# Patient Record
Sex: Female | Born: 2000
Health system: Southern US, Community
[De-identification: ages and names within clinical notes are randomized; demographics above are authoritative.]

## PROBLEM LIST (undated history)

## (undated) DIAGNOSIS — F419 Anxiety disorder, unspecified: Secondary | ICD-10-CM

## (undated) DIAGNOSIS — R569 Unspecified convulsions: Secondary | ICD-10-CM

## (undated) HISTORY — PX: TONSILLECTOMY: SUR1361

## (undated) HISTORY — DX: Anxiety disorder, unspecified: F41.9

## (undated) HISTORY — DX: Unspecified convulsions: R56.9

---

## 2018-11-25 DIAGNOSIS — Z130 Encounter for screening for diseases of the blood and blood-forming organs and certain disorders involving the immune mechanism: Secondary | ICD-10-CM | POA: Diagnosis not present

## 2019-02-18 DIAGNOSIS — R079 Chest pain, unspecified: Secondary | ICD-10-CM | POA: Diagnosis not present

## 2019-02-20 ENCOUNTER — Telehealth: Payer: Self-pay | Admitting: *Deleted

## 2019-02-20 NOTE — Telephone Encounter (Signed)
Left message for Lisa Hancock to call and give phone number for this patient so we can call to schedule

## 2019-02-28 ENCOUNTER — Other Ambulatory Visit: Payer: Self-pay

## 2019-02-28 ENCOUNTER — Ambulatory Visit (INDEPENDENT_AMBULATORY_CARE_PROVIDER_SITE_OTHER): Payer: BC Managed Care – PPO | Admitting: Cardiovascular Disease

## 2019-02-28 ENCOUNTER — Encounter: Payer: Self-pay | Admitting: Cardiovascular Disease

## 2019-02-28 VITALS — BP 112/70 | HR 72 | Temp 97.3°F | Ht 67.0 in | Wt 162.6 lb

## 2019-02-28 DIAGNOSIS — R002 Palpitations: Secondary | ICD-10-CM | POA: Diagnosis not present

## 2019-02-28 DIAGNOSIS — I498 Other specified cardiac arrhythmias: Secondary | ICD-10-CM | POA: Diagnosis not present

## 2019-02-28 DIAGNOSIS — R072 Precordial pain: Secondary | ICD-10-CM | POA: Diagnosis not present

## 2019-02-28 NOTE — Patient Instructions (Signed)
Medication Instructions: None ordered    Lab work: None ordered   Testing/Procedures: Schedule Echo  Follow-Up: At [facility], you and your health needs are our priority.  As part of our continuing mission to provide you with exceptional heart care, we have created designated Provider Care Teams.  These Care Teams include your primary Cardiologist (physician) and Advanced Practice Providers (APPs -  Physician Assistants and Nurse Practitioners) who all work together to provide you with the care you need, when you need it. . Schedule follow up in 3 months

## 2019-02-28 NOTE — Progress Notes (Signed)
Cardiology Office Note:   Date:  02/28/2019  NAME:  Lisa Hancock    MRN: [id number] DOB:  2000/11/15   PCP:  Keith Rake, MD  Cardiologist:  No primary care provider on file.  Electrophysiologist:  None   Referring MD: Keith Rake, MD   Chief Complaint  Patient presents with   Chest Pain   History of Present Illness:   Lisa Hancock is a 18 y.o. female with a hx of seizure disorder who is being seen today for the evaluation of chest pain at the request of Olevia Bowens Milon Dikes, MD.  She presents for the evaluation of chest pain and palpitations that occurred during exercise last week.  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  Apparently during an episode of sprints she complained of exertional chest tightness, as well as associated palpitations.  It was reported that her heart rate remained elevated into the 130s for nearly 15 minutes after cessation of exercise.  She states she did not feel lightheaded dizzy and had no syncope.  She reports that the symptoms scared her and were concerning to her trainer.  She was evaluated by a family physician at [facility] and they have sent her to me.  Her symptoms appear to have improved with hydration per report per the family physician.  She reports she is never had this with exercise.  She is able to complete sprints and jog any distance she likes without real symptoms.  She also reports since December she gets occasional chest tightness.  It Connick comes and goes and without any triggers.  She is on Zoloft for associated depressive symptoms that occurred with her and her seizure disorder.  Regarding her medical history, she reports a long history of partial seizures that are controlled on therapy.  She states she had her last seizure nearly 2 years ago.  She also reports she does suffer from panic attacks and has not had one in 2 years.  She reports her overall mood is good.  In question regarding family history she reports no history of  any family members dropping dead at a certain young age.  She has 10+ siblings who do not have any medical problems.  She reports her parents are both healthy.  Her grandparents have routine stuff of elderly age such as diabetes high blood pressure and prostate cancer.  There is really no strong family history of cardiomyopathy or any sinister cardiovascular pathology.  Past Medical History: Past Medical History:  Diagnosis Date   Anxiety    Seizure [facility])     Past Surgical History: Past Surgical History:  Procedure Laterality Date   TONSILLECTOMY      Current Medications: Current Meds  Medication Sig   folic acid (FOLVITE) 784 MCG tablet Take 400 mcg by mouth daily.   OXCARBAZEPINE PO Take by mouth. Take 5 tablets (300 mg total) by mouth at bedtime.   sertraline (ZOLOFT) 25 MG tablet Take 25 mg by mouth daily.     Allergies:    Patient has no known allergies.   Social History: Social History   Socioeconomic History   Marital status: Single    Spouse name: Not on file   Number of children: Not on file   Years of education: Not on file   Highest education level: Not on file  Occupational History   Not on file  Social Needs   Financial resource strain: Not on file   Food insecurity  Worry: Not on file    Inability: Not on file   Transportation needs    Medical: Not on file    Non-medical: Not on file  Tobacco Use   Smoking status: Never Smoker   Smokeless tobacco: Never Used  Substance and Sexual Activity   Alcohol use: Never    Frequency: Never   Drug use: Never   Sexual activity: Not Currently  Lifestyle   Physical activity    Days per week: Not on file    Minutes per session: Not on file   Stress: Not on file  Relationships   Social connections    Talks on phone: Not on file    Gets together: Not on file    Attends religious service: Not on file    Active member of club or organization: Not on file    Attends meetings of clubs or  organizations: Not on file    Relationship status: Not on file  Other Topics Concern   Not on file  Social History Narrative   Not on file     Family History: The patient's family history includes Asthma in her maternal grandmother; Breast cancer in her maternal grandmother; Diabetes in her maternal grandfather.  ROS:   All other ROS reviewed and negative. Pertinent positives noted in the HPI.     EKGs/Labs/Other Studies Reviewed:   The following studies were personally reviewed by me today:  EKG:  EKG is ordered today.  The ekg ordered today demonstrates normal sinus rhythm, sinus arrhythmia noted, rather pronounced, increased voltage, however no diagnosis of LVH, suspect athlete's heart, no evidence of prior infarction, and was personally reviewed by me.   Recent Labs: No results found for requested labs within last 8760 hours.   Recent Lipid Panel No results found for: CHOL, TRIG, HDL, CHOLHDL, VLDL, LDLCALC, LDLDIRECT  Physical Exam:   VS:  BP 112/70    Pulse 72    Temp (!) 97.3 F (36.3 C) (Temporal)    Ht 5\' 7"  (1.702 m)    Wt 162 lb 9.6 oz (73.8 kg)    SpO2 99%    BMI 25.47 kg/m    Wt Readings from Last 3 Encounters:  02/28/19 162 lb 9.6 oz (73.8 kg)    General: Well nourished, well developed, in no acute distress Heart: Atraumatic, normal size  Eyes: PEERLA, EOMI  Neck: Supple, no JVD Endocrine: No thryomegaly Cardiac: Normal S1, S2; RRR; no murmurs, rubs, or gallops Lungs: Clear to auscultation bilaterally, no wheezing, rhonchi or rales  Abd: Soft, nontender, no hepatomegaly  Ext: No edema, pulses 2+ Musculoskeletal: No deformities, BUE and BLE strength normal and equal Skin: Warm and dry, no rashes   Neuro: Alert and oriented to person, place, time, and situation, CNII-XII grossly intact, no focal deficits  Psych: Normal mood and affect   ASSESSMENT:   NAME@ is a 18 y.o. female who presents for the following: 1. Precordial pain   2. Palpitations   3.  Sinus arrhythmia     PLAN:   1. Precordial pain 2. Palpitations 3. Sinus arrhythmia -She presents with symptoms of exertional chest tightness and rapid heart rate after strenuous activity.  She did not have any syncope, but did feel lightheaded.  Her symptoms apparently started with sprints, and lasted 15 minutes into cessation of activity.  Per report from the family physician her symptoms did improve with hydration.  I have been asked to evaluate her for clearance to return to sports.  Her  EKG here in office shows increased voltage likely consistent with athlete's heart.  She does have pretty pronounced sinus arrhythmia, a normal finding.  She has no strong family history to suggest she has underlying sinister cardiac pathology.  Her examination is extremely benign. -To start we will obtain a TSH, BMP today -I will obtain a echocardiogram to ensure she has no underlying heart pathology, her exam is benign today I suspect nothing -If her echocardiogram is normal I think will be reasonable to let her return to activity.  We can plan to see her back in 3 months after the above work-up.  If she has symptoms again we can consider pursuing a monitor. -I will update the athletic trainer for the [facility] women's basketball team, Nigel Berthold, her number is [phone]  Disposition: No follow-ups on file.  Medication Adjustments/Labs and Tests Ordered: Current medicines are reviewed at length with the patient today.  Concerns regarding medicines are outlined above.  Orders Placed This Encounter  Procedures   Basic metabolic panel   TSH   EKG 12-Lead   ECHOCARDIOGRAM COMPLETE   No orders of the defined types were placed in this encounter.   Patient Instructions  Medication Instructions: None ordered    Lab work: None ordered   Testing/Procedures: Schedule Echo  Follow-Up: At [facility], you and your health needs are our priority.  As part of our continuing mission to provide  you with exceptional heart care, we have created designated Provider Care Teams.  These Care Teams include your primary Cardiologist (physician) and Advanced Practice Providers (APPs -  Physician Assistants and Nurse Practitioners) who all work together to provide you with the care you need, when you need it.  Schedule follow up in 3 months      Signed, Gerri Spore T. Flora Lipps, MD [facility]  [address], [address] [place], [place] [address] [phone]  02/28/2019 4:01 PM

## 2019-03-01 LAB — BASIC METABOLIC PANEL
BUN/Creatinine Ratio: 10 (ref 9–23)
BUN: 9 mg/dL (ref 6–20)
CO2: 24 mmol/L (ref 20–29)
Calcium: 9.7 mg/dL (ref 8.7–10.2)
Chloride: 105 mmol/L (ref 96–106)
Creatinine, Ser: 0.86 mg/dL (ref 0.57–1.00)
GFR calc Af Amer: 112 mL/min/{1.73_m2} (ref >59)
GFR calc non Af Amer: 98 mL/min/{1.73_m2} (ref >59)
Glucose: 92 mg/dL (ref 65–99)
Potassium: 4.6 mmol/L (ref 3.5–5.2)
Sodium: 140 mmol/L (ref 134–144)

## 2019-03-01 LAB — TSH: TSH: 1.33 u[IU]/mL (ref 0.450–4.500)

## 2019-03-04 ENCOUNTER — Telehealth: Payer: Self-pay | Admitting: Cardiovascular Disease

## 2019-03-04 NOTE — Telephone Encounter (Signed)
  Patient needs clarification that it is okay for her to practice basketball.

## 2019-03-04 NOTE — Telephone Encounter (Signed)
S/W pt she informed pt no practice/exercise until after ECHO. Verbalizes understanding

## 2019-03-04 NOTE — Telephone Encounter (Signed)
New Message     Pt is calling about being cleared. She says she saw on My chart she has to wait until she has her Echo  She would like clarification    Please call

## 2019-03-04 NOTE — Telephone Encounter (Signed)
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 Informed pt not to practice/exercise until after ECHO is completed. Verbalizes understanding

## 2019-03-10 ENCOUNTER — Other Ambulatory Visit: Payer: Self-pay

## 2019-03-10 ENCOUNTER — Ambulatory Visit (HOSPITAL_COMMUNITY): Payer: BC Managed Care – PPO | Attending: Cardiology

## 2019-03-10 DIAGNOSIS — U071 COVID-19: Secondary | ICD-10-CM | POA: Diagnosis not present

## 2019-03-10 DIAGNOSIS — R002 Palpitations: Secondary | ICD-10-CM | POA: Diagnosis not present

## 2019-03-10 DIAGNOSIS — R072 Precordial pain: Secondary | ICD-10-CM | POA: Insufficient documentation

## 2019-03-10 DIAGNOSIS — Z03818 Encounter for observation for suspected exposure to other biological agents ruled out: Secondary | ICD-10-CM | POA: Diagnosis not present

## 2019-03-11 ENCOUNTER — Telehealth: Payer: Self-pay | Admitting: Cardiovascular Disease

## 2019-03-11 DIAGNOSIS — R072 Precordial pain: Secondary | ICD-10-CM

## 2019-03-11 DIAGNOSIS — R931 Abnormal findings on diagnostic imaging of heart and coronary circulation: Secondary | ICD-10-CM

## 2019-03-11 DIAGNOSIS — R002 Palpitations: Secondary | ICD-10-CM

## 2019-03-11 NOTE — Telephone Encounter (Signed)
Attempted to call Mrs. Hetz with echo results. We will need to get an MRI and have a period of deconditioning. Voicemail full. Will attempt to call back later today.   Evalina Field, MD

## 2019-03-11 NOTE — Addendum Note (Signed)
Addended by: Raiford Simmonds on: 03/11/2019 05:35 PM   Modules accepted: Orders

## 2019-03-11 NOTE — Telephone Encounter (Signed)
Order placed,  Information sent To pre- cert. For authorization and scheduling

## 2019-03-11 NOTE — Telephone Encounter (Signed)
Called Lisa Hancock about her results. There are concerns for LV noncompaction, and mildly reduced EF. Review of echo not a slam dunk for noncompaction. Will keep her out of competitive sports for now, and will get a cardiac MRI. Her diastolic function is supranormal, so I suspect this is athlete's heart. We will proceed with above work-up due to concern. I have sent a note to get this MRI asap.   Evalina Field, MD

## 2019-03-17 DIAGNOSIS — Z03818 Encounter for observation for suspected exposure to other biological agents ruled out: Secondary | ICD-10-CM | POA: Diagnosis not present

## 2019-03-17 DIAGNOSIS — U071 COVID-19: Secondary | ICD-10-CM | POA: Diagnosis not present

## 2019-03-18 ENCOUNTER — Encounter: Payer: Self-pay | Admitting: Cardiovascular Disease

## 2019-04-01 ENCOUNTER — Telehealth (HOSPITAL_COMMUNITY): Payer: Self-pay | Admitting: Emergency Medicine

## 2019-04-01 NOTE — Telephone Encounter (Signed)
VM box not set up, unable to leave message  

## 2019-04-02 ENCOUNTER — Other Ambulatory Visit: Payer: Self-pay

## 2019-04-02 ENCOUNTER — Ambulatory Visit (HOSPITAL_COMMUNITY)
Admission: RE | Admit: 2019-04-02 | Discharge: 2019-04-02 | Disposition: A | Discharge status: Home / Self Care | Payer: BC Managed Care – PPO | Source: Ambulatory Visit | Attending: Cardiovascular Disease | Admitting: Cardiovascular Disease

## 2019-04-02 DIAGNOSIS — R931 Abnormal findings on diagnostic imaging of heart and coronary circulation: Secondary | ICD-10-CM | POA: Diagnosis not present

## 2019-04-02 DIAGNOSIS — R002 Palpitations: Secondary | ICD-10-CM

## 2019-04-02 DIAGNOSIS — R072 Precordial pain: Secondary | ICD-10-CM | POA: Diagnosis not present

## 2019-04-02 MED ORDER — GADOBUTROL 1 MMOL/ML IV SOLN
8.0000 mL | Freq: Once | INTRAVENOUS | Status: AC | PRN
  Administered 2019-04-02: 8 mL via INTRAVENOUS

## 2019-04-04 ENCOUNTER — Telehealth: Payer: Self-pay | Admitting: Cardiovascular Disease

## 2019-04-04 NOTE — Telephone Encounter (Signed)
Called Mrs. Aja about cardiac MRI. Discussed with reading physician that her picture is more consistent with athlete's heart. She has very normal diastolic function and EF is low normal. The report will be amended to reflect this clinical history. I have given her the ok to return to sports for now. I discussed this with her and her Trainer Lovette Cliche). She will see me on 06/03/2019 for follow-up.   [place] T. Audie Box, [place]  [address] [place], [place] [address] [phone]  2:23 PM

## 2019-04-07 DIAGNOSIS — Z03818 Encounter for observation for suspected exposure to other biological agents ruled out: Secondary | ICD-10-CM | POA: Diagnosis not present

## 2019-04-07 DIAGNOSIS — Z20828 Contact with and (suspected) exposure to other viral communicable diseases: Secondary | ICD-10-CM | POA: Diagnosis not present

## 2019-06-02 NOTE — Progress Notes (Signed)
Cardiology Office Note:   Date:  06/03/2019  NAME:  Lisa Hancock    MRN: [id number] DOB:  12-29-2000   PCP:  Blenda MountsBeck, Lisa Todd, MD  Cardiologist:  No primary care provider on file.   Referring MD: Blenda MountsBeck, Lisa Todd, MD   Chief Complaint  Patient presents with  . Palpitations   History of Present Illness:   Lisa Hancock is a 18 y.o. female with a hx of seizure disorder who presents for follow-up of palpitations. Recent TTE with concerns for low-normal EF and non-compaction. CMR shows no evidence of non-compaction and likely athlete's heart. Was seen in September for tachycardia that occurred during basketball practice at request of [facility] trainer.  She had an echocardiogram performed that was incorrectly read as concerns for LV noncompaction.  On my review of that echocardiogram she has supranormal diastolic function and a low normal ejection fraction which is commonly seen in athletic heart.  Her cardiac MRI confirmed normal ejection fraction and no evidence of LV noncompaction.  Given that she is quite athletic all of her findings are very normal.  She reports has had no further episodes of palpitations.  She still can get sharp intermittent chest pain that occurs with extremely heavy exertion.  The pain gets better with rest and increase water intake.  Her trainer is watching her closely and has been no major issues.  She is returned to 100% sports capacity and they have started competitive play.  She is doing well with this and I am overall pleased with how she is doing.  She has had no further episodes of palpitations and has had no syncope.  Her chest pain episodes appear to be getting better and she seems to be in better spirits.  She reports she is doing well in classes and there are no major issues.  She denies any chest pain, shortness of breath, palpitations, lower extremity edema today.  Past Medical History: Past Medical History:  Diagnosis Date  . Anxiety   . Seizure [facility](H[facility])      Past Surgical History: Past Surgical History:  Procedure Laterality Date  . TONSILLECTOMY      Current Medications: Current Meds  Medication Sig  . folic acid (FOLVITE) 400 MCG tablet Take 400 mcg by mouth daily.  Marland Kitchen. OXCARBAZEPINE PO Take by mouth. Take 5 tablets (300 mg total) by mouth at bedtime.  . sertraline (ZOLOFT) 25 MG tablet Take 25 mg by mouth daily.     Allergies:    Patient has no known allergies.   Social History: Social History   Socioeconomic History  . Marital status: Single    Spouse name: Not on file  . Number of children: Not on file  . Years of education: Not on file  . Highest education level: Not on file  Occupational History  . Not on file  Social Needs  . Financial resource strain: Not on file  . Food insecurity    Worry: Not on file    Inability: Not on file  . Transportation needs    Medical: Not on file    Non-medical: Not on file  Tobacco Use  . Smoking status: Never Smoker  . Smokeless tobacco: Never Used  Substance and Sexual Activity  . Alcohol use: Never    Frequency: Never  . Drug use: Never  . Sexual activity: Not Currently  Lifestyle  . Physical activity    Days per week: Not on file    Minutes per session: Not on file  .  Stress: Not on file  Relationships  . Social Herbalist on phone: Not on file    Gets together: Not on file    Attends religious service: Not on file    Active member of club or organization: Not on file    Attends meetings of clubs or organizations: Not on file    Relationship status: Not on file  Other Topics Concern  . Not on file  Social History Narrative  . Not on file     Family History: The patient's family history includes Asthma in her maternal grandmother; Breast cancer in her maternal grandmother; Diabetes in her maternal grandfather.  ROS:   All other ROS reviewed and negative. Pertinent positives noted in the HPI.     EKGs/Labs/Other Studies Reviewed:   The following  studies were personally reviewed by me today:  Recent Labs: 02/28/2019: BUN 9; Creatinine, Ser 0.86; Potassium 4.6; Sodium 140; TSH 1.330   Recent Lipid Panel No results found for: CHOL, TRIG, HDL, CHOLHDL, VLDL, LDLCALC, LDLDIRECT  Physical Exam:   VS:  BP 113/60   Pulse 67   Ht 5\' 7"  (1.702 m)   Wt 162 lb 9.6 oz (73.8 kg)   BMI 25.47 kg/m    Wt Readings from Last 3 Encounters:  06/03/19 162 lb 9.6 oz (73.8 kg)  02/28/19 162 lb 9.6 oz (73.8 kg)    General: Well nourished, well developed, in no acute distress Heart: Atraumatic, normal size  Eyes: PEERLA, EOMI  Neck: Supple, no JVD Endocrine: No thryomegaly Cardiac: Normal S1, S2; RRR; no murmurs, rubs, or gallops Lungs: Clear to auscultation bilaterally, no wheezing, rhonchi or rales  Abd: Soft, nontender, no hepatomegaly  Ext: No edema, pulses 2+ Musculoskeletal: No deformities, BUE and BLE strength normal and equal Skin: Warm and dry, no rashes   Neuro: Alert and oriented to person, place, time, and situation, CNII-XII grossly intact, no focal deficits  Psych: Normal mood and affect   ASSESSMENT:   Lisa Hancock is a 18 y.o. female who presents for the following: 1. Palpitations   2. Athlete's heart   3. Precordial pain     PLAN:   1. Palpitations 2. Athlete's heart 3. Precordial pain -I have extensively reviewed her echocardiogram as well as cardiac MRI.  Her echocardiogram demonstrates hyper trabeculation which is commonly seen in African-American athletes.  She does not have LV noncompaction.  Her ejection fraction is within normal limits for a very athletic person.  A lower limit of normal for an athlete is 45%.  Her MRI confirms an ejection fraction of 50%.  Given her supra normal diastolic function, she does not have a cardiomyopathy.  She has no symptoms to suggest she has a cardiomyopathy and no strong family history.  Her palpitations have not recurred and her chest pain is rather atypical.  Given that she is  returned back to 100% capacity without any issues, this is reassuring.  Moving forward, she should not be precluded from any sports participation.  She does not have a cardiomyopathy.  I spent an extensive amount of time counseling on her results and what an athletic heart means.  It means she is extremely normal and extremely healthy.  For now, we will see her on an as-needed basis.  Should she have any issues she or her athletic trainer can reach out to [place].  Disposition: Return if symptoms worsen or fail to improve.  Medication Adjustments/Labs and Tests Ordered: Current medicines are reviewed at  length with the patient today.  Concerns regarding medicines are outlined above.  No orders of the defined types were placed in this encounter.  No orders of the defined types were placed in this encounter.   Patient Instructions  Medication Instructions:  NO CHANGE *If you need a refill on your cardiac medications before your next appointment, please call your pharmacy*  Lab Work: If you have labs (blood work) drawn today and your tests are completely normal, you will receive your results only by: Marland Kitchen MyChart Message (if you have MyChart) OR . A paper copy in the mail If you have any lab test that is abnormal or we need to change your treatment, we will call you to review the results.  Follow-Up: At [facility], you and your health needs are our priority.  As part of our continuing mission to provide you with exceptional heart care, we have created designated Provider Care Teams.  These Care Teams include your primary Cardiologist (physician) and Advanced Practice Providers (APPs -  Physician Assistants and Nurse Practitioners) who all work together to provide you with the care you need, when you need it.  Your next appointment:    AS NEEDED  Provider:   Lennie Odor, MD       Signed, Lenna Gilford. Flora Lipps, MD [facility]  [address], [address] [place], [place]  [address] [phone]  06/03/2019 4:18 PM

## 2019-06-03 ENCOUNTER — Ambulatory Visit (INDEPENDENT_AMBULATORY_CARE_PROVIDER_SITE_OTHER): Payer: BC Managed Care – PPO | Admitting: Cardiovascular Disease

## 2019-06-03 ENCOUNTER — Other Ambulatory Visit: Payer: Self-pay

## 2019-06-03 ENCOUNTER — Encounter: Payer: Self-pay | Admitting: Cardiovascular Disease

## 2019-06-03 VITALS — BP 113/60 | HR 67 | Ht 67.0 in | Wt 162.6 lb

## 2019-06-03 DIAGNOSIS — I517 Cardiomegaly: Secondary | ICD-10-CM | POA: Diagnosis not present

## 2019-06-03 DIAGNOSIS — R072 Precordial pain: Secondary | ICD-10-CM | POA: Diagnosis not present

## 2019-06-03 DIAGNOSIS — R002 Palpitations: Secondary | ICD-10-CM

## 2019-06-03 NOTE — Patient Instructions (Signed)
Medication Instructions:  NO CHANGE *If you need a refill on your cardiac medications before your next appointment, please call your pharmacy*  Lab Work: If you have labs (blood work) drawn today and your tests are completely normal, you will receive your results only by: Marland Kitchen MyChart Message (if you have MyChart) OR . A paper copy in the mail If you have any lab test that is abnormal or we need to change your treatment, we will call you to review the results.  Follow-Up: At [facility], you and your health needs are our priority.  As part of our continuing mission to provide you with exceptional heart care, we have created designated Provider Care Teams.  These Care Teams include your primary Cardiologist (physician) and Advanced Practice Providers (APPs -  Physician Assistants and Nurse Practitioners) who all work together to provide you with the care you need, when you need it.  Your next appointment:    AS NEEDED  Provider:   Eleonore Chiquito, MD

## 2019-10-09 ENCOUNTER — Other Ambulatory Visit: Payer: Self-pay

## 2019-10-09 ENCOUNTER — Ambulatory Visit (INDEPENDENT_AMBULATORY_CARE_PROVIDER_SITE_OTHER): Payer: BC Managed Care – PPO | Admitting: Surgical

## 2019-10-09 ENCOUNTER — Encounter: Payer: Self-pay | Admitting: Surgical

## 2019-10-09 ENCOUNTER — Ambulatory Visit: Payer: Self-pay

## 2019-10-09 DIAGNOSIS — M25572 Pain in left ankle and joints of left foot: Secondary | ICD-10-CM

## 2019-10-09 DIAGNOSIS — S82832A Other fracture of upper and lower end of left fibula, initial encounter for closed fracture: Secondary | ICD-10-CM

## 2019-10-09 DIAGNOSIS — S82839A Other fracture of upper and lower end of unspecified fibula, initial encounter for closed fracture: Secondary | ICD-10-CM

## 2019-10-09 MED ORDER — MELOXICAM 15 MG PO TABS: 15.0000 mg | ORAL_TABLET | Freq: Every day | ORAL | 0 refills | Status: DC

## 2019-10-09 NOTE — Progress Notes (Signed)
Office Visit Note   Patient: Lisa Hancock           Date of Birth: [birth year]           MRN: [id number] Visit Date: 10/09/2019 Requested by: Blenda Mounts, MD [address] [place],  [place] [address] PCP: Blenda Mounts, MD  Subjective: Chief Complaint  Patient presents with  . Left Ankle - Pain    HPI: Lisa Hancock is a 19 y.o. female who presents to the office complaining of Left ankle pain.  Patient reports that 2 days ago she fell down stairs and injured her left ankle.  She complains of lateral sided left ankle pain.  She has been ambulating full weightbearing in a walker boot.  She notes swelling and has not been taking any medication.  She does have a previous injury to the same ankle that occurred during a game in high school with that injury was much worse than it is currently.  She is able to walk on it and she is avoiding any cardiovascular exercises that requires using her ankle.  She denies any pain throughout the forefoot, midfoot, hindfoot or any pain near the fibular head.              ROS:  All systems reviewed are negative as they relate to the chief complaint within the history of present illness.  Patient denies fevers or chills.  Assessment & Plan: Visit Diagnoses:  1. Avulsion fracture of distal fibula   2. Pain in left ankle and joints of left foot     Plan: Patient is a 19 year old female who presents complaining of left ankle pain.  It has been present for 2 days since she fell down stairs.  She has been ambulating full weightbearing in a boot which has helped her pain.  She does note swelling over the lateral malleolus.  X-rays of the left ankle taken today reveal a very small avulsion fracture of the distal fibula.  No other fractures were identified.  She does have point tenderness over this fracture as well as tenderness over the ATFL and CFL ligaments.  No tenderness throughout the rest of the foot or ankle.  Plan to continue full weightbearing status  and walker boot.  She will return to the office in 2 weeks for clinical recheck.  If swelling is significantly improved then we will transition to a ankle brace and then initiate peroneal exercises at that time.  Also prescribed meloxicam to take daily to help with pain and swelling.  Patient agreed with plan will follow up in 2 weeks.  Follow-Up Instructions: No follow-ups on file.   Orders:  Orders Placed This Encounter  Procedures  . XR Ankle Complete Left   No orders of the defined types were placed in this encounter.     Procedures: No procedures performed   Clinical Data: No additional findings.  Objective: Vital Signs: There were no vitals taken for this visit.  Physical Exam:  Constitutional: Patient appears well-developed HEENT:  Head: Normocephalic Eyes:EOM are normal Neck: Normal range of motion Cardiovascular: Normal rate Pulmonary/chest: Effort normal Neurologic: Patient is alert Skin: Skin is warm Psychiatric: Patient has normal mood and affect  Ortho Exam:  Tenderness to palpation over the distal aspect of the lateral malleolus.  Tenderness to palpation over the ATFL and CFL ligaments.  No significant tenderness to palpation over the medial malleolus, deltoid ligament, retrocalcaneal space, Achilles tendon, Achilles tendon insertion, fifth metatarsal base, forefoot, Lisfranc complex.  Sensation intact through all dermatomes of the left lower extremity.  Swelling is present over the lateral malleolus.  Specialty Comments:  No specialty comments available.  Imaging: No results found.   PMFS History: There are no problems to display for this patient.  Past Medical History:  Diagnosis Date  . Anxiety   . Seizure [facility])     Family History  Problem Relation Age of Onset  . Breast cancer Maternal Grandmother   . Asthma Maternal Grandmother   . Diabetes Maternal Grandfather     Past Surgical History:  Procedure Laterality Date  . TONSILLECTOMY      Social History   Occupational History  . Not on file  Tobacco Use  . Smoking status: Never Smoker  . Smokeless tobacco: Never Used  Substance and Sexual Activity  . Alcohol use: Never  . Drug use: Never  . Sexual activity: Not Currently

## 2019-10-23 ENCOUNTER — Ambulatory Visit: Payer: Self-pay

## 2019-10-23 ENCOUNTER — Ambulatory Visit (INDEPENDENT_AMBULATORY_CARE_PROVIDER_SITE_OTHER): Payer: BC Managed Care – PPO | Admitting: Orthopedic Surgery

## 2019-10-23 ENCOUNTER — Other Ambulatory Visit: Payer: Self-pay

## 2019-10-23 DIAGNOSIS — S82839A Other fracture of upper and lower end of unspecified fibula, initial encounter for closed fracture: Secondary | ICD-10-CM | POA: Diagnosis not present

## 2019-10-24 ENCOUNTER — Encounter: Payer: Self-pay | Admitting: Orthopedic Surgery

## 2019-10-24 NOTE — Progress Notes (Signed)
   Post-Op Visit Note   Patient: Lisa Hancock           Date of Birth: February 13, 2001           MRN: [id number] Visit Date: 10/23/2019 PCP: Blenda Mounts, MD   Assessment & Plan:  Chief Complaint:  Chief Complaint  Patient presents with  . Left Ankle - Follow-up   Visit Diagnoses:  1. Avulsion fracture of distal fibula     Plan: Patient is a 19 year old female presents s/p left distal fibula avulsion fracture sustained on 10/07/2019.  Patient is doing better and ambulating full weightbearing with the boot.  She still has some swelling but this is significantly reduced compared with the previous visit.  Her pain is improved and she has been taking meloxicam with good relief.  She denies any numbness or tingling.  She denies any pain elsewhere aside from some Achilles tendinitis that she has dealt with in the past and she is working with her Event organiser regarding this.  Radiographs of the left ankle taken today reveal no change in the placement of the nondisplaced fibular avulsion fracture.  No further fracture or dislocation is noted on radiographs.  Plan for patient to transition from boot into sports ankle brace that she has at home.  She will work on peroneal exercises with her Event organiser.  Follow-up with the office as needed.  Follow-Up Instructions: No follow-ups on file.   Orders:  Orders Placed This Encounter  Procedures  . XR Ankle Complete Left   No orders of the defined types were placed in this encounter.   Imaging: No results found.  PMFS History: There are no problems to display for this patient.  Past Medical History:  Diagnosis Date  . Anxiety   . Seizure [facility])     Family History  Problem Relation Age of Onset  . Breast cancer Maternal Grandmother   . Asthma Maternal Grandmother   . Diabetes Maternal Grandfather     Past Surgical History:  Procedure Laterality Date  . TONSILLECTOMY     Social History   Occupational History  . Not on  file  Tobacco Use  . Smoking status: Never Smoker  . Smokeless tobacco: Never Used  Substance and Sexual Activity  . Alcohol use: Never  . Drug use: Never  . Sexual activity: Not Currently

## 2019-10-25 ENCOUNTER — Encounter: Payer: Self-pay | Admitting: Orthopedic Surgery

## 2019-12-16 DIAGNOSIS — J988 Other specified respiratory disorders: Secondary | ICD-10-CM | POA: Diagnosis not present

## 2019-12-30 ENCOUNTER — Other Ambulatory Visit: Payer: Self-pay

## 2019-12-30 DIAGNOSIS — R569 Unspecified convulsions: Secondary | ICD-10-CM

## 2019-12-30 NOTE — Progress Notes (Signed)
Will call [facility] to find out when we can get pt seen. KH

## 2020-01-01 ENCOUNTER — Encounter: Payer: Self-pay | Admitting: Neurology

## 2020-01-01 ENCOUNTER — Ambulatory Visit (INDEPENDENT_AMBULATORY_CARE_PROVIDER_SITE_OTHER): Payer: BC Managed Care – PPO | Admitting: Neurology

## 2020-01-01 ENCOUNTER — Other Ambulatory Visit: Payer: Self-pay

## 2020-01-01 VITALS — BP 111/68 | HR 67 | Ht 67.0 in | Wt 168.5 lb

## 2020-01-01 DIAGNOSIS — F418 Other specified anxiety disorders: Secondary | ICD-10-CM

## 2020-01-01 DIAGNOSIS — R569 Unspecified convulsions: Secondary | ICD-10-CM | POA: Diagnosis not present

## 2020-01-01 MED ORDER — LAMOTRIGINE 100 MG PO TABS: 100.0000 mg | ORAL_TABLET | Freq: Two times a day (BID) | ORAL | 11 refills | Status: DC

## 2020-01-01 NOTE — Patient Instructions (Signed)
The pharmacy has the prescription for lamotrigine 100 mg tablets. For 5 days, just take one half pill a day. For the next 5 days, take one half pill twice a day. For the next 5 days, take one half pill 3 times a day Then start taking one pill twice a day from this point on.      If you get a rash, need to stop the medication and not take it again. 
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
## 2020-01-02 ENCOUNTER — Telehealth: Payer: Self-pay | Admitting: Neurology

## 2020-01-02 NOTE — Telephone Encounter (Signed)
[employer] Auth: [id number] (exp. 01/02/20 to 06/29/20)/mutual of [place] order sent to [facility]. They will reach out to the patient to schedule.

## 2020-01-05 ENCOUNTER — Ambulatory Visit (INDEPENDENT_AMBULATORY_CARE_PROVIDER_SITE_OTHER): Payer: BC Managed Care – PPO | Admitting: Neurology

## 2020-01-05 ENCOUNTER — Other Ambulatory Visit: Payer: Self-pay

## 2020-01-05 DIAGNOSIS — R569 Unspecified convulsions: Secondary | ICD-10-CM

## 2020-01-05 NOTE — Progress Notes (Signed)
   [facility]  EEG (ELECTROENCEPHALOGRAM) REPORT   STUDY DATE: 01/05/2020 PATIENT NAME: Lisa Hancock DOB: 02-24-2001 MRN: [id number]  ORDERING CLINICIAN: Kristianne Albin A. Epimenio Foot, MD. PhD  TECHNOLOGIST: Elvis Coil, RPSGT  TECHNIQUE: Electroencephalogram was recorded utilizing standard 10-20 system of lead placement and reformatted into average and bipolar montages.  RECORDING TIME: 30 minutes 54 seconds  CLINICAL INFORMATION: 19 year old woman with spells of altered consciousness  FINDINGS: A digital EEG was performed while the patient was awake and drowsy. While awake and most alert there was a 10 hz posterior dominant rhythm. Voltages and frequencies were symmetric.  There were no focal, lateralizing, epileptiform activity or seizures seen.  Photic stimulation had a normal driving response. Hyperventilation and recovery did not change the underlying rhythms. EKG channel shows normal sinus rhythm.  The patient did not become drowsy or fall asleep.Marland Kitchen  IMPRESSION: This is a normal EEG while the patient was awake.   INTERPRETING PHYSICIAN:   Artasia Thang A. Epimenio Foot, MD, PhD, [facility] Certified in Neurology, Clinical Neurophysiology, Sleep Medicine, Pain Medicine and Neuroimaging  [facility] [address] [place], [place] [address] [phone]

## 2020-01-06 ENCOUNTER — Encounter: Payer: Self-pay | Admitting: *Deleted

## 2020-01-08 ENCOUNTER — Telehealth: Payer: Self-pay | Admitting: *Deleted

## 2020-01-08 NOTE — Telephone Encounter (Signed)
Request faxed to [facility] [phone]

## 2020-03-10 DIAGNOSIS — J988 Other specified respiratory disorders: Secondary | ICD-10-CM | POA: Diagnosis not present

## 2020-04-06 ENCOUNTER — Telehealth: Payer: Self-pay | Admitting: *Deleted

## 2020-04-06 NOTE — Telephone Encounter (Signed)
Received fax from [facility] pharmacy that "pt reported she has never taken lamotrigine, lamotrigine needs titrating.Marland KitchenMarland Kitchen*note rx from 01/01/20"

## 2020-04-06 NOTE — Telephone Encounter (Signed)
The patient was given this schedule:  The pharmacy has the prescription for lamotrigine 100 mg tablets. For 5 days, just take one half pill a day. For the next 5 days, take one half pill twice a day. For the next 5 days, take one half pill 3 times a day Then start taking one pill twice a day from this point on.       If you get a rash, need to stop the medication and not take it again.

## 2020-04-06 NOTE — Telephone Encounter (Signed)
Ill take care of it. TY.

## 2020-04-06 NOTE — Telephone Encounter (Signed)
Lisa Hancock- pt has appt with you tomorrow. Can you discuss this with her? Thank you

## 2020-04-06 NOTE — Telephone Encounter (Signed)
Would you like for her to discuss this with Lisa Hancock at her appt tomorrow?
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
## 2020-04-06 NOTE — Patient Instructions (Addendum)
According to [place] law, you can not drive unless you are seizure / syncope free for at least 6 months and under physician's care.  Please maintain precautions. Do not participate in activities where a loss of awareness could harm you or someone else. No swimming alone, no tub bathing, no hot tubs, no driving, no operating motorized vehicles (cars, ATVs, motocycles, etc), lawnmowers, power tools or firearms. No standing at heights, such as rooftops, ladders or stairs. Avoid hot objects such as stoves, heaters, open fires. Wear a helmet when riding a bicycle, scooter, skateboard, etc. and avoid areas of traffic. Set your water heater to 120 degrees or less.   Please stop oxcarbazepine. We will start lamotrigine 100mg  twice daily. I would like for you to titrate this dose as follows:  For 5 days, just take one half pill (50mg ) once a day. For the next 5 days, take one half pill (50mg ) twice a day (morning and evening). For the next 5 days, take one half pill (50mg ) 3 times a day (morning, lunch and evening) Then start taking one pill (100mg  ) twice a day from this point on.   **Please discontinue immediately and call with any unusual side effects or unusual rash**   Follow up in 3 months     Lamotrigine tablets What is this medicine? LAMOTRIGINE (la MOE ) is used to control seizures in adults and children with epilepsy and Lennox-Gastaut syndrome. It is also used in adults to treat bipolar disorder. This medicine may be used for other purposes; ask your health care provider or pharmacist if you have questions. COMMON BRAND NAME(S): Lamictal, Subvenite What should I tell my health care provider before I take this medicine? They need to know if you have any of these conditions:  aseptic meningitis during prior use of lamotrigine  depression  folate deficiency  kidney disease  liver disease  suicidal thoughts, plans, or attempt; a previous suicide attempt by you or a family  member  an unusual or allergic reaction to lamotrigine or other seizure medications, other medicines, foods, dyes, or preservatives  pregnant or trying to get pregnant  breast-feeding How should I use this medicine? Take this medicine by mouth with a glass of water. Follow the directions on the prescription label. Do not chew these tablets. If this medicine upsets your stomach, take it with food or milk. Take your doses at regular intervals. Do not take your medicine more often than directed. A special MedGuide will be given to you by the pharmacist with each new prescription and refill. Be sure to read this information carefully each time. Talk to your pediatrician regarding the use of this medicine in children. While this drug may be prescribed for children as young as 2 years for selected conditions, precautions do apply. Overdosage: If you think you have taken too much of this medicine contact a poison control center or emergency room at once. NOTE: This medicine is only for you. Do not share this medicine with others. What if I miss a dose? If you miss a dose, take it as soon as you can. If it is almost time for your next dose, take only that dose. Do not take double or extra doses. What may interact with this medicine?  atazanavir  carbamazepine  female hormones, including contraceptive or birth control pills  lopinavir  methotrexate  phenobarbital  phenytoin  primidone  pyrimethamine  rifampin  ritonavir  trimethoprim  valproic acid This list may not describe  all possible interactions. Give your health care provider a list of all the medicines, herbs, non-prescription drugs, or dietary supplements you use. Also tell them if you smoke, drink alcohol, or use illegal drugs. Some items may interact with your medicine. What should I watch for while using this medicine? Visit your doctor or health care provider for regular checks on your progress. If you take this  medicine for seizures, wear a Medic Alert bracelet or necklace. Carry an identification card with information about your condition, medicines, and doctor or health care provider. It is important to take this medicine exactly as directed. When first starting treatment, your dose will need to be adjusted slowly. It may take weeks or months before your dose is stable. You should contact your doctor or health care provider if your seizures get worse or if you have any new types of seizures. Do not stop taking this medicine unless instructed by your doctor or health care provider. Stopping your medicine suddenly can increase your seizures or their severity. This medicine may cause serious skin reactions. They can happen weeks to months after starting the medicine. Contact your health care provider right away if you notice fevers or flu-like symptoms with a rash. The rash may be red or purple and then turn into blisters or peeling of the skin. Or, you might notice a red rash with swelling of the face, lips or lymph nodes in your neck or under your arms. You may get drowsy, dizzy, or have blurred vision. Do not drive, use machinery, or do anything that needs mental alertness until you know how this medicine affects you. To reduce dizzy or fainting spells, do not sit or stand up quickly, especially if you are an older patient. Alcohol can increase drowsiness and dizziness. Avoid alcoholic drinks. If you are taking this medicine for bipolar disorder, it is important to report any changes in your mood to your doctor or health care provider. If your condition gets worse, you get mentally depressed, feel very hyperactive or manic, have difficulty sleeping, or have thoughts of hurting yourself or committing suicide, you need to get help from your health care provider right away. If you are a caregiver for someone taking this medicine for bipolar disorder, you should also report these behavioral changes right away. The use of  this medicine may increase the chance of suicidal thoughts or actions. Pay special attention to how you are responding while on this medicine. Your mouth may get dry. Chewing sugarless gum or sucking hard candy, and drinking plenty of water may help. Contact your doctor if the problem does not go away or is severe. Women who become pregnant while using this medicine may enroll in the [place] American Antiepileptic Drug Pregnancy Registry by calling ([phone] [phone]. This registry collects information about the safety of antiepileptic drug use during pregnancy. This medicine may cause a decrease in folic acid. You should make sure that you get enough folic acid while you are taking this medicine. Discuss the foods you eat and the vitamins you take with your health care provider. What side effects may I notice from receiving this medicine? Side effects that you should report to your doctor or health care professional as soon as possible:  allergic reactions like skin rash, itching or hives, swelling of the face, lips, or tongue  changes in vision  depressed mood  elevated mood, decreased need for sleep, racing thoughts, impulsive behavior  loss of balance or coordination  mouth sores  rash, fever,  and swollen lymph nodes  redness, blistering, peeling or loosening of the skin, including inside the mouth  right upper belly pain  seizures  severe muscle pain  signs and symptoms of aseptic meningitis such as stiff neck and sensitivity to light, headache, drowsiness, fever, nausea, vomiting, rash  signs of infection - fever or chills, cough, sore throat, pain or difficulty passing urine  suicidal thoughts or other mood changes  swollen lymph nodes  trouble walking  unusual bruising or bleeding  unusually weak or tired  yellowing of the eyes or skin Side effects that usually do not require medical attention (report to your doctor or health care professional if they continue or are  bothersome):  diarrhea  dizziness  dry mouth  stuffy nose  tiredness  tremors  trouble sleeping This list may not describe all possible side effects. Call your doctor for medical advice about side effects. You may report side effects to FDA at 1-800-FDA-1088. Where should I keep my medicine? Keep out of reach of children. Store at room temperature between 15 and 30 degrees C (59 and 86 degrees F). Throw away any unused medicine after the expiration date. NOTE: This sheet is a summary. It may not cover all possible information. If you have questions about this medicine, talk to your doctor, pharmacist, or health care provider.  2020 Elsevier/Gold Standard (2018-09-13 15:03:40)    Seizure, Adult A seizure is a sudden burst of abnormal electrical activity in the brain. Seizures usually last from 30 seconds to 2 minutes. They can cause many different symptoms. Usually, seizures are not harmful unless they last a long time. What are the causes? Common causes of this condition include:  Fever or infection.  Conditions that affect the brain, such as: ? A brain abnormality that you were born with. ? A brain or head injury. ? Bleeding in the brain. ? A tumor. ? Stroke. ? Brain disorders such as autism or cerebral palsy.  Low blood sugar.  Conditions that are passed from parent to child (are inherited).  Problems with substances, such as: ? Having a reaction to a drug or a medicine. ? Suddenly stopping the use of a substance (withdrawal). In some cases, the cause may not be known. A person who has repeated seizures over time without a clear cause has a condition called epilepsy. What increases the risk? You are more likely to get this condition if you have:  A family history of epilepsy.  Had a seizure in the past.  A brain disorder.  A history of head injury, lack of oxygen at birth, or strokes. What are the signs or symptoms? There are many types of seizures. The  symptoms vary depending on the type of seizure you have. Examples of symptoms during a seizure include:  Shaking (convulsions).  Stiffness in the body.  Passing out (losing consciousness).  Head nodding.  Staring.  Not responding to sound or touch.  Loss of bladder control and bowel control. Some people have symptoms right before and right after a seizure happens. Symptoms before a seizure may include:  Fear.  Worry (anxiety).  Feeling like you may vomit (nauseous).  Feeling like the room is spinning (vertigo).  Feeling like you saw or heard something before (dj vu).  Odd tastes or smells.  Changes in how you see. You may see flashing lights or spots. Symptoms after a seizure happens can include:  Confusion.  Sleepiness.  Headache.  Weakness on one side of the body. How is  this treated? Most seizures will stop on their own in under 5 minutes. In these cases, no treatment is needed. Seizures that last longer than 5 minutes will usually need treatment. Treatment can include:  Medicines given through an IV tube.  Avoiding things that are known to cause your seizures. These can include medicines that you take for another condition.  Medicines to treat epilepsy.  Surgery to stop the seizures. This may be needed if medicines do not help. Follow these instructions at home: Medicines  Take over-the-counter and prescription medicines only as told by your doctor.  Do not eat or drink anything that may keep your medicine from working, such as alcohol. Activity  Do not do any activities that would be dangerous if you had another seizure, like driving or swimming. Wait until your doctor says it is safe for you to do them.  If you live in the [place] ask your local DMV (department of motor vehicles) when you can drive.  Get plenty of rest. Teaching others Teach friends and family what to do when you have a seizure. They should:  Lay you on the ground.  Protect  your head and body.  Loosen any tight clothing around your neck.  Turn you on your side.  Not hold you down.  Not put anything into your mouth.  Know whether or not you need emergency care.  Stay with you until you are better.  General instructions  Contact your doctor each time you have a seizure.  Avoid anything that gives you seizures.  Keep a seizure diary. Write down: ? What you think caused each seizure. ? What you remember about each seizure.  Keep all follow-up visits as told by your doctor. This is important. Contact a doctor if:  You have another seizure.  You have seizures more often.  There is any change in what happens during your seizures.  You keep having seizures with treatment.  You have symptoms of being sick or having an infection. Get help right away if:  You have a seizure that: ? Lasts longer than 5 minutes. ? Is different than seizures you had before. ? Makes it harder to breathe. ? Happens after you hurt your head.  You have any of these symptoms after a seizure: ? Not being able to speak. ? Not being able to use a part of your body. ? Confusion. ? A bad headache.  You have two or more seizures in a row.  You do not wake up right after a seizure.  You get hurt during a seizure. These symptoms may be an emergency. Do not wait to see if the symptoms will go away. Get medical help right away. Call your local emergency services (911 in the [place] Do not drive yourself to the hospital. Summary  Seizures usually last from 30 seconds to 2 minutes. Usually, they are not harmful unless they last a long time.  Do not eat or drink anything that may keep your medicine from working, such as alcohol.  Teach friends and family what to do when you have a seizure.  Contact your doctor each time you have a seizure. This information is not intended to replace advice given to you by your health care provider. Make sure you discuss any questions you  have with your health care provider. Document Revised: 08/30/2018 Document Reviewed: 08/30/2018 Elsevier Patient Education  2020 [employer].

## 2020-04-06 NOTE — Telephone Encounter (Signed)
Yes that would be good

## 2020-04-07 ENCOUNTER — Encounter: Payer: Self-pay | Admitting: Family Medicine

## 2020-04-07 ENCOUNTER — Ambulatory Visit: Payer: BC Managed Care – PPO | Admitting: Family Medicine

## 2020-04-07 ENCOUNTER — Other Ambulatory Visit: Payer: Self-pay

## 2020-04-07 VITALS — BP 110/77 | HR 74 | Ht 67.0 in | Wt 162.0 lb

## 2020-04-07 DIAGNOSIS — R569 Unspecified convulsions: Secondary | ICD-10-CM

## 2020-04-07 DIAGNOSIS — F418 Other specified anxiety disorders: Secondary | ICD-10-CM

## 2020-04-07 MED ORDER — LAMOTRIGINE 100 MG PO TABS: 100.0000 mg | ORAL_TABLET | Freq: Two times a day (BID) | ORAL | 11 refills | Status: DC

## 2020-04-07 NOTE — Progress Notes (Signed)
Faxed printed/signed rx lamotrigine to [facility] at [phone]. Received fax confirmation.

## 2020-04-07 NOTE — Progress Notes (Signed)
[facility] called and asked the instructions be sent on a new rx

## 2020-04-07 NOTE — Progress Notes (Signed)
I have read the note, and I agree with the clinical assessment and plan.  Lenox Ladouceur A. Janaiah Vetrano, MD, PhD, FAAN Certified in Neurology, Clinical Neurophysiology, Sleep Medicine, Pain Medicine and Neuroimaging  [facility] [address], [address] [place], [place] [address] [phone]  

## 2020-05-18 DIAGNOSIS — F4311 Post-traumatic stress disorder, acute: Secondary | ICD-10-CM | POA: Diagnosis not present

## 2020-05-25 DIAGNOSIS — F4311 Post-traumatic stress disorder, acute: Secondary | ICD-10-CM | POA: Diagnosis not present

## 2020-06-08 DIAGNOSIS — F4311 Post-traumatic stress disorder, acute: Secondary | ICD-10-CM | POA: Diagnosis not present

## 2020-06-22 DIAGNOSIS — F4311 Post-traumatic stress disorder, acute: Secondary | ICD-10-CM | POA: Diagnosis not present

## 2020-06-30 DIAGNOSIS — F4311 Post-traumatic stress disorder, acute: Secondary | ICD-10-CM | POA: Diagnosis not present

## 2020-07-05 DIAGNOSIS — R079 Chest pain, unspecified: Secondary | ICD-10-CM | POA: Diagnosis not present

## 2020-07-07 ENCOUNTER — Other Ambulatory Visit: Payer: Self-pay

## 2020-07-07 ENCOUNTER — Ambulatory Visit: Payer: BC Managed Care – PPO | Admitting: Cardiovascular Disease

## 2020-07-07 ENCOUNTER — Encounter: Payer: Self-pay | Admitting: Cardiovascular Disease

## 2020-07-07 VITALS — BP 110/72 | HR 74 | Ht 67.0 in | Wt 166.4 lb

## 2020-07-07 DIAGNOSIS — R002 Palpitations: Secondary | ICD-10-CM | POA: Diagnosis not present

## 2020-07-07 DIAGNOSIS — F4311 Post-traumatic stress disorder, acute: Secondary | ICD-10-CM | POA: Diagnosis not present

## 2020-07-07 DIAGNOSIS — I517 Cardiomegaly: Secondary | ICD-10-CM

## 2020-07-07 NOTE — Progress Notes (Signed)
Cardiology Office Note:   Date:  07/07/2020  NAME:  Lisa Hancock    MRN: [id number] DOB:  May 07, 2001   PCP:  Blenda Mounts, MD  Cardiologist:  No primary care provider on file.   Referring MD: Blenda Mounts, MD   Chief Complaint  Patient presents with  . Follow-up   History of Present Illness:   Lisa Hancock is a 20 y.o. female with a hx of seizures, athletic heart who presents for follow-up. Evaluated last year for tachycardia while at [facility] practice. Echo read incorrectly has abnormal that led to MRI. She has athletic heart changes. Recently had COVID on 06/25/2020.  Symptoms included cough and sore throat.  She reports he did have some chest pain but she has had chest pain symptoms for a long time.  She has noncardiac chest pain.  Associated with stress and certain activity.  She underwent extensive evaluation last year which showed normal LV function.  She has athletic heart.  She has no symptoms concerning for heart failure.  She has no chest pain in office today.  Her EKG today demonstrates normal sinus rhythm with sinus arrhythmia.  She does have increased voltage but this is related to athletic changes.  She has no evidence of heart failure on examination.  Regarding her rapid heartbeat she had in the past has had no further episodes.  She seems to be doing well from a heart standpoint.  She has recovered from COVID.  Problem List 1. Athlete's heart  -EF 50%, normal TDI med e' 12.1 cm/s, lat e' 13.8 cm/s  -CMR negative for non-compaction -LV hypertrabeculation related to athlete   Past Medical History: Past Medical History:  Diagnosis Date  . Anxiety   . Seizure [facility])     Past Surgical History: Past Surgical History:  Procedure Laterality Date  . TONSILLECTOMY      Current Medications: Current Meds  Medication Sig  . Folic Acid 5 MG CAPS Take by mouth. Taking 5 mg by mouth daily  . lamoTRIgine (LAMICTAL) 100 MG tablet Take 1 tablet (100 mg total) by  mouth 2 (two) times daily. For 5 days, just take one half pill a day. For the next 5 days, take one half pill twice a day. For the next 5 days, take one half pill 3 times a day Then start taking one pill twice a day from this point on.   If you get a rash, need to stop the medication and not take it again.  . Oxcarbazepine (TRILEPTAL) 300 MG tablet Take by mouth.  . sertraline (ZOLOFT) 25 MG tablet Take 25 mg by mouth daily.     Allergies:    Patient has no known allergies.   Social History: Social History   Socioeconomic History  . Marital status: Single    Spouse name: Not on file  . Number of children: Not on file  . Years of education: Not on file  . Highest education level: Not on file  Occupational History  . Not on file  Tobacco Use  . Smoking status: Never Smoker  . Smokeless tobacco: Never Used  Substance and Sexual Activity  . Alcohol use: Never  . Drug use: Never  . Sexual activity: Not Currently  Other Topics Concern  . Not on file  Social History Narrative   Right handed    No caffeine    Social Determinants of Health   Financial Resource Strain: Not on file  Food Insecurity: Not on file  Transportation Needs: Not on file  Physical Activity: Not on file  Stress: Not on file  Social Connections: Not on file    Family History: The patient's family history includes Asthma in her maternal grandmother; Breast cancer in her maternal grandmother; Diabetes in her maternal grandfather.  ROS:   All other ROS reviewed and negative. Pertinent positives noted in the HPI.     EKGs/Labs/Other Studies Reviewed:   The following studies were personally reviewed by me today:  EKG:  EKG is ordered today.  The ekg ordered today demonstrates normal sinus rhythm heart rate 74 with sinus arrhythmia, no acute ischemic changes, no evidence of prior infarction, and was personally reviewed by me.   TTE 03/10/2019 1. The left ventricle has mildly reduced systolic  function, with an  ejection fraction of 45-50%. The cavity size was normal. Left ventricular  diastolic parameters were normal. Left ventrical global hypokinesis  without regional wall motion abnormalities.  2. The right ventricle has normal systolic function. The cavity was  normal. There is no increase in right ventricular wall thickness. Right  ventricular systolic pressure is normal.  3. No evidence of mitral valve stenosis.  4. The aortic valve is tricuspid. No stenosis of the aortic valve.  5. The aorta is normal unless otherwise noted.  6. The aortic root, ascending aorta and aortic arch are normal in size  and structure.   CMR 04/04/2019 IMPRESSION: 1. Mildly dilated left ventricle with mildly decreased systolic function (LVEF = 50%) and mild diffuse hypokinesis. There are no regional wall motion abnormalities and ratio of non-compacted to compacted myocardium 2.1:1. There is diffuse midwall late gadolinium enhancement in the left ventricular myocardium.  2. Mildly dilated right ventricle with normal thickness and mildly diffusely decreased systolic function (LVEF = 40%). There are no regional wall motion abnormalities.  3.  Normal left and right atrial size.  4.  Trivial mitral and mild tricuspid regurgitation.  Collectively, these findings are consistent with non-ischemic dilated cardiomyopathy with mild biventricular involvement. However, these findings don't fit the criteria for non-compaction cardiomyopathy (pronounced non-compacted myocardium with ratio of non-compacted to compacted myocardium > 2.3:1).  Recent Labs: No results found for requested labs within last 8760 hours.   Recent Lipid Panel No results found for: CHOL, TRIG, HDL, CHOLHDL, VLDL, LDLCALC, LDLDIRECT  Physical Exam:   VS:  BP 110/72   Pulse 74   Ht 5\' 7"  (1.702 m)   Wt 166 lb 6.4 oz (75.5 kg)   BMI 26.06 kg/m    Wt Readings from Last 3 Encounters:  07/07/20 166 lb 6.4 oz (75.5  kg)  04/07/20 162 lb (73.5 kg)  01/01/20 168 lb 8 oz (76.4 kg)    General: Well nourished, well developed, in no acute distress Head: Atraumatic, normal size  Eyes: PEERLA, EOMI  Neck: Supple, no JVD Endocrine: No thryomegaly Cardiac: Normal S1, S2; RRR; no murmurs, rubs, or gallops Lungs: Clear to auscultation bilaterally, no wheezing, rhonchi or rales  Abd: Soft, nontender, no hepatomegaly  Ext: No edema, pulses 2+ Musculoskeletal: No deformities, BUE and BLE strength normal and equal Skin: Warm and dry, no rashes   Neuro: Alert and oriented to person, place, time, and situation, CNII-XII grossly intact, no focal deficits  Psych: Normal mood and affect   ASSESSMENT:   Lisa Hancock is a 20 y.o. female who presents for the following: 1. Athlete's heart   2. Palpitations     PLAN:   1. Athlete's heart -She underwent evaluation last  year for palpitations while participating in sport.  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  The end result of her testing last year was that she has athletic heart. -She now presents with recent COVID infection.  EKG shows normal sinus rhythm with sinus arrhythmia.  There is no evidence of conduction disease. -She has no evidence of heart failure examination. -I recommended a high-sensitivity troponin.  As long as this is normal she can go back to sport tomorrow.  I will be in touch with the trainer Aura Dials [phone]) about the test results.  Disposition: Return if symptoms worsen or fail to improve.  Medication Adjustments/Labs and Tests Ordered: Current medicines are reviewed at length with the patient today.  Concerns regarding medicines are outlined above.  Orders Placed This Encounter  Procedures  . EKG 12-Lead   No orders of the defined types were placed in this encounter.   Patient Instructions  Medication Instructions:  The current medical regimen is effective;  continue present plan and  medications.  *If you need a refill on your cardiac medications before your next appointment, please call your pharmacy*   Lab Work: Troponin tomorrow at 7:45 AM [address] office  If you have labs (blood work) drawn today and your tests are completely normal, you will receive your results only by: Marland Kitchen MyChart Message (if you have MyChart) OR . A paper copy in the mail If you have any lab test that is abnormal or we need to change your treatment, we will call you to review the results.   Follow-Up: At [facility], you and your health needs are our priority.  As part of our continuing mission to provide you with exceptional heart care, we have created designated Provider Care Teams.  These Care Teams include your primary Cardiologist (physician) and Advanced Practice Providers (APPs -  Physician Assistants and Nurse Practitioners) who all work together to provide you with the care you need, when you need it.  We recommend signing up for the patient portal called "MyChart".  Sign up information is provided on this After Visit Summary.  MyChart is used to connect with patients for Virtual Visits (Telemedicine).  Patients are able to view lab/test results, encounter notes, upcoming appointments, etc.  Non-urgent messages can be sent to your provider as well.   To learn more about what you can do with MyChart, go to [email or web address].    Your next appointment:   As needed  The format for your next appointment:   In Person  Provider:   Lennie Odor, MD       Time Spent with Patient: I have spent a total of 25 minutes with patient reviewing hospital notes, telemetry, EKGs, labs and examining the patient as well as establishing an assessment and plan that was discussed with the patient.  > 50% of time was spent in direct patient care.  Signed, Lenna Gilford. Flora Lipps, MD [facility]  [address], [address] [place], [place] [address] [phone]  07/07/2020  4:59 PM

## 2020-07-07 NOTE — Patient Instructions (Addendum)
Medication Instructions:  The current medical regimen is effective;  continue present plan and medications.  *If you need a refill on your cardiac medications before your next appointment, please call your pharmacy*   Lab Work: Troponin tomorrow at 7:45 AM [address] office  If you have labs (blood work) drawn today and your tests are completely normal, you will receive your results only by: Marland Kitchen MyChart Message (if you have MyChart) OR . A paper copy in the mail If you have any lab test that is abnormal or we need to change your treatment, we will call you to review the results.   Follow-Up: At [facility], you and your health needs are our priority.  As part of our continuing mission to provide you with exceptional heart care, we have created designated Provider Care Teams.  These Care Teams include your primary Cardiologist (physician) and Advanced Practice Providers (APPs -  Physician Assistants and Nurse Practitioners) who all work together to provide you with the care you need, when you need it.  We recommend signing up for the patient portal called "MyChart".  Sign up information is provided on this After Visit Summary.  MyChart is used to connect with patients for Virtual Visits (Telemedicine).  Patients are able to view lab/test results, encounter notes, upcoming appointments, etc.  Non-urgent messages can be sent to your provider as well.   To learn more about what you can do with MyChart, go to [email or web address].    Your next appointment:   As needed  The format for your next appointment:   In Person  Provider:   Lennie Odor, MD

## 2020-07-08 ENCOUNTER — Telehealth: Payer: Self-pay | Admitting: Cardiovascular Disease

## 2020-07-08 ENCOUNTER — Other Ambulatory Visit: Payer: Self-pay

## 2020-07-08 ENCOUNTER — Other Ambulatory Visit: Payer: BC Managed Care – PPO | Admitting: *Deleted

## 2020-07-08 DIAGNOSIS — I517 Cardiomegaly: Secondary | ICD-10-CM

## 2020-07-08 LAB — TROPONIN I (HIGH SENSITIVITY): Troponin I (High Sensitivity): 25 ng/L — ABNORMAL HIGH (ref <18)

## 2020-07-08 NOTE — Telephone Encounter (Signed)
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  Troponin value 25.  Normal less than 18.  She did do yoga yesterday.  I suspect this value is related to that.  She was instructed to do no physical activity today other than going to class and we will plan to repeat the lab draw tomorrow at 2 PM.  If the value remains elevated she will need an echocardiogram.  Trainer aware.  She will have Jalyn there for lab values tomorrow at 2 PM.  Gerri Spore T. Flora Lipps, MD [facility]  [address] [place], [place] [address] [phone]  10:36 AM

## 2020-07-09 ENCOUNTER — Other Ambulatory Visit: Payer: Self-pay

## 2020-07-09 ENCOUNTER — Other Ambulatory Visit: Payer: BC Managed Care – PPO

## 2020-07-09 LAB — TROPONIN I (HIGH SENSITIVITY): Troponin I (High Sensitivity): 13 ng/L (ref <18)

## 2020-07-13 NOTE — Patient Instructions (Signed)
Below is our plan:  We will continue lamotrigine 100mg  twice daily.   Please make sure you are staying well hydrated. I recommend 50-60 ounces daily. Well balanced diet and regular exercise encouraged.    Please continue follow up with care team as directed.   Follow up with me in 4-5 months (before graduation)  You may receive a survey regarding today's visit. I encourage you to leave honest feed back as I do use this information to improve patient care. Thank you for seeing me today!      Seizure, Adult A seizure is a sudden burst of abnormal electrical and chemical activity in the brain. Seizures usually last from 30 seconds to 2 minutes.  What are the causes? Common causes of this condition include:  Fever or infection.  Problems that affect the brain. These may include: ? A brain or head injury. ? Bleeding in the brain. ? A brain tumor.  Low levels of blood sugar or salt.  Kidney problems or liver problems.  Conditions that are passed from parent to child (are inherited).  Problems with a substance, such as: ? Having a reaction to a drug or a medicine. ? Stopping the use of a substance all of a sudden (withdrawal).  A stroke.  Disorders that affect how you develop. Sometimes, the cause may not be known.  What increases the risk?  Having someone in your family who has epilepsy. In this condition, seizures happen again and again over time. They have no clear cause.  Having had a tonic-clonic seizure before. This type of seizure causes you to: ? Tighten the muscles of the whole body. ? Lose consciousness.  Having had a head injury or strokes before.  Having had a lack of oxygen at birth. What are the signs or symptoms? There are many types of seizures. The symptoms vary depending on the type of seizure you have. Symptoms during a seizure  Shaking that you cannot control (convulsions) with fast, jerky movements of muscles.  Stiffness of the  body.  Breathing problems.  Feeling mixed up (confused).  Staring or not responding to sound or touch.  Head nodding.  Eyes that blink, flutter, or move fast.  Drooling, grunting, or making clicking sounds with your mouth  Losing control of when you pee or poop. Symptoms before a seizure  Feeling afraid, nervous, or worried.  Feeling like you may vomit.  Feeling like: ? You are moving when you are not. ? Things around you are moving when they are not.  Feeling like you saw or heard something before (dj vu).  Odd tastes or smells.  Changes in how you see. You may see flashing lights or spots. Symptoms after a seizure  Feeling confused.  Feeling sleepy.  Headache.  Sore muscles. How is this treated? If your seizure stops on its own, you will not need treatment. If your seizure lasts longer than 5 minutes, you will normally need treatment. Treatment may include:  Medicines given through an IV tube.  Avoiding things, such as medicines, that are known to cause your seizures.  Medicines to prevent seizures.  A device to prevent or control seizures.  Surgery.  A diet low in carbohydrates and high in fat (ketogenic diet). Follow these instructions at home: Medicines  Take over-the-counter and prescription medicines only as told by your doctor.  Avoid foods or drinks that may keep your medicine from working, such as alcohol. Activity  Follow instructions about driving, swimming, or doing things that  would be dangerous if you had another seizure. Wait until your doctor says it is safe for you to do these things.  If you live in the [place] ask your local department of motor vehicles when you can drive.  Get a lot of rest. Teaching others  Teach friends and family what to do when you have a seizure. They should: ? Help you get down to the ground. ? Protect your head and body. ? Loosen any clothing around your neck. ? Turn you on your side. ? Know whether  or not you need emergency care. ? Stay with you until you are better.  Also, tell them what not to do if you have a seizure. Tell them: ? They should not hold you down. ? They should not put anything in your mouth.   General instructions  Avoid anything that gives you seizures.  Keep a seizure diary. Write down: ? What you remember about each seizure. ? What you think caused each seizure.  Keep all follow-up visits. Contact a doctor if:  You have another seizure or seizures. Call the doctor each time you have a seizure.  The pattern of your seizures changes.  You keep having seizures with treatment.  You have symptoms of being sick or having an infection.  You are not able to take your medicine. Get help right away if:  You have any of these problems: ? A seizure that lasts longer than 5 minutes. ? Many seizures in a row and you do not feel better between seizures. ? A seizure that makes it harder to breathe. ? A seizure and you can no longer speak or use part of your body.  You do not wake up right after a seizure.  You get hurt during a seizure.  You feel confused or have pain right after a seizure. These symptoms may be an emergency. Get help right away. Call your local emergency services (911 in the [place]  Do not wait to see if the symptoms will go away.  Do not drive yourself to the hospital. Summary  A seizure is a sudden burst of abnormal electrical and chemical activity in the brain. Seizures normally last from 30 seconds to 2 minutes.  Causes of seizures include illness, injury to the head, low levels of blood sugar or salt, and certain conditions.  Most seizures will stop on their own in less than 5 minutes. Seizures that last longer than 5 minutes are a medical emergency and need treatment right away.  Many medicines are used to treat seizures. Take over-the-counter and prescription medicines only as told by your doctor. This information is not  intended to replace advice given to you by your health care provider. Make sure you discuss any questions you have with your health care provider. Document Revised: 12/19/2019 Document Reviewed: 12/19/2019 Elsevier Patient Education  2021 [employer].
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
## 2020-07-14 ENCOUNTER — Encounter: Payer: Self-pay | Admitting: Family Medicine

## 2020-07-14 ENCOUNTER — Ambulatory Visit: Payer: BC Managed Care – PPO | Admitting: Family Medicine

## 2020-07-14 VITALS — BP 107/73 | HR 68 | Ht 67.0 in | Wt 168.8 lb

## 2020-07-14 DIAGNOSIS — R569 Unspecified convulsions: Secondary | ICD-10-CM

## 2020-07-14 DIAGNOSIS — F418 Other specified anxiety disorders: Secondary | ICD-10-CM | POA: Diagnosis not present

## 2020-07-14 MED ORDER — LAMOTRIGINE 100 MG PO TABS: 100.0000 mg | ORAL_TABLET | Freq: Two times a day (BID) | ORAL | 3 refills | Status: DC

## 2020-07-14 NOTE — Progress Notes (Signed)
I have read the note, and I agree with the clinical assessment and plan.  Baptiste Littler A. Reyann Troop, MD, PhD, FAAN Certified in Neurology, Clinical Neurophysiology, Sleep Medicine, Pain Medicine and Neuroimaging  [facility] [address], [address] [place], [place] [address] [phone]  

## 2020-07-22 DIAGNOSIS — F4311 Post-traumatic stress disorder, acute: Secondary | ICD-10-CM | POA: Diagnosis not present

## 2020-07-28 DIAGNOSIS — F4311 Post-traumatic stress disorder, acute: Secondary | ICD-10-CM | POA: Diagnosis not present

## 2020-08-04 NOTE — Progress Notes (Signed)
Cardiology Office Note:   Date:  08/05/2020  NAME:  Lisa Hancock    MRN: [id number] DOB:  [birth year]   PCP:  Blenda Mounts, MD  Cardiologist:  No primary care provider on file.   Referring MD: Blenda Mounts, MD   Chief Complaint  Patient presents with   Chest Pain   History of Present Illness:   Lisa Hancock is a 20 y.o. female with a hx of seizures, athlete's heart, atypical chest pain who presents for follow-up of chest pain. She reports roughly 2 weeks ago she developed some tightness and soreness in her chest. This occurred while sitting. Symptoms lasted most of the night. Her athletic trainer state whether. Symptoms were improved by heating pad. She also took Tylenol with some improvement. Symptoms went away. She is intermittently experiencing chest tightness when she runs. She also reports that palpation of the area shows the chest underneath her left breast is tender. She denies any shortness of breath. She is currently out of competitive basketball. She apparently will enter the transfer portal try to go back closer to home in [place]. She may end up at [employer]. She is awaiting final decisions on this. No recent stress or depression. School seems to be going well. This is a significant change for her. She does have a history of noncardiac chest pain which can be worsened with depression and anxiety. No recent seizures. BP 102/54. Heart rate 64. EKG demonstrates sinus rhythm with marked sinus arrhythmia which is normal for her. No acute ischemic changes.  Problem List 1. Athlete's heart  -EF 50%, normal TDI med e' 12.1 cm/s, lat e' 13.8 cm/s  -CMR negative for non-compaction -LV hypertrabeculation related to athlete   Past Medical History: Past Medical History:  Diagnosis Date   Anxiety    Seizure (HCC)     Past Surgical History: Past Surgical History:  Procedure Laterality Date   TONSILLECTOMY      Current Medications: Current Meds   Medication Sig   Folic Acid 5 MG CAPS Take by mouth. Taking 5 mg by mouth daily   ibuprofen (ADVIL) 800 MG tablet Take 1 tablet (800 mg total) by mouth 3 (three) times daily.   lamoTRIgine (LAMICTAL) 100 MG tablet Take 1 tablet (100 mg total) by mouth 2 (two) times daily. Take 100mg  twice daily   sertraline (ZOLOFT) 25 MG tablet Take 25 mg by mouth daily.     Allergies:    Patient has no known allergies.   Social History: Social History   Socioeconomic History   Marital status: Single    Spouse name: Not on file   Number of children: Not on file   Years of education: Not on file   Highest education level: Not on file  Occupational History   Not on file  Tobacco Use   Smoking status: Never Smoker   Smokeless tobacco: Never Used  Substance and Sexual Activity   Alcohol use: Never   Drug use: Never   Sexual activity: Not Currently  Other Topics Concern   Not on file  Social History Narrative   Right handed    No caffeine    Social Determinants of Health   Financial Resource Strain: Not on file  Food Insecurity: Not on file  Transportation Needs: Not on file  Physical Activity: Not on file  Stress: Not on file  Social Connections: Not on file     Family History: The patient's family history includes Asthma in her maternal  grandmother; Breast cancer in her maternal grandmother; Diabetes in her maternal grandfather.  ROS:   All other ROS reviewed and negative. Pertinent positives noted in the HPI.     EKGs/Labs/Other Studies Reviewed:   The following studies were personally reviewed by me today:  EKG:  EKG is ordered today.  The ekg ordered today demonstrates normal sinus rhythm heart rate 64, no acute ischemic changes, sinus arrhythmia noted, and was personally reviewed by me.   TTE 03/10/2019 1. The left ventricle has mildly reduced systolic function, with an  ejection fraction of 45-50%. The cavity size was normal. Left ventricular  diastolic  parameters were normal. Left ventrical global hypokinesis  without regional wall motion abnormalities.  2. The right ventricle has normal systolic function. The cavity was  normal. There is no increase in right ventricular wall thickness. Right  ventricular systolic pressure is normal.  3. No evidence of mitral valve stenosis.  4. The aortic valve is tricuspid. No stenosis of the aortic valve.  5. The aorta is normal unless otherwise noted.  6. The aortic root, ascending aorta and aortic arch are normal in size  and structure.   Recent Labs: No results found for requested labs within last 8760 hours.   Recent Lipid Panel No results found for: CHOL, TRIG, HDL, CHOLHDL, VLDL, LDLCALC, LDLDIRECT  Physical Exam:   VS:  BP (!) 102/54    Pulse 64    Ht 5\' 7"  (1.702 m)    Wt 168 lb (76.2 kg)    SpO2 100%    BMI 26.31 kg/m    Wt Readings from Last 3 Encounters:  08/05/20 168 lb (76.2 kg)  07/14/20 168 lb 12.8 oz (76.6 kg)  07/07/20 166 lb 6.4 oz (75.5 kg)    General: Well nourished, well developed, in no acute distress Head: Atraumatic, normal size  Eyes: PEERLA, EOMI  Neck: Supple, no JVD Endocrine: No thryomegaly Cardiac: Normal S1, S2; RRR; no murmurs, rubs, or gallops Lungs: Clear to auscultation bilaterally, no wheezing, rhonchi or rales  Abd: Soft, nontender, no hepatomegaly  Ext: No edema, pulses 2+ Musculoskeletal: No deformities, BUE and BLE strength normal and equal Skin: Warm and dry, no rashes   Neuro: Alert and oriented to person, place, time, and situation, CNII-XII grossly intact, no focal deficits  Psych: Normal mood and affect   ASSESSMENT:   Lisa Hancock is a 20 y.o. female who presents for the following: 1. Costochondritis   2. Chest pain, unspecified type   3. Athlete's heart     PLAN:   1. Costochondritis 2. Chest pain, unspecified type -She reports chest pain that is worse with palpation of the area. Described as tightness. Symptoms have improved  with heating pad and Tylenol. Her symptoms are reproducible on palpation on examination. This is consistent with costochondritis. Her EKG is unchanged from prior with sinus rhythm and sinus arrhythmia which is a normal finding in healthy athlete. She underwent extensive evaluation and the ultimate diagnosis was athlete's heart. -I recommended costochondritis treatment which will include ibuprofen 800 mg 3 times daily for 7 days. She will drink plenty of water with this. She will see 36 back as needed. -There is no suspicion for underlying obstructive CAD or any heart disease. Again she underwent extensive evaluation in the past  3. Athlete's heart -Extensive evaluation for tachycardia in the past. Ultimate diagnosis was athlete's heart. She has no evidence of LV noncompaction. Her heart is quite healthy.  Disposition: Return if symptoms worsen or  fail to improve.  Medication Adjustments/Labs and Tests Ordered: Current medicines are reviewed at length with the patient today.  Concerns regarding medicines are outlined above.  Orders Placed This Encounter  Procedures   EKG 12-Lead   Meds ordered this encounter  Medications   ibuprofen (ADVIL) 800 MG tablet    Sig: Take 1 tablet (800 mg total) by mouth 3 (three) times daily.    Dispense:  21 tablet    Refill:  0    Patient Instructions  Medication Instructions:  Please start ibuprofen 800 mg three times a day for 7 days please drink 80-100 oz of water a day with this *If you need a refill on your cardiac medications before your next appointment, please call your pharmacy*   Follow-Up: At [facility], you and your health needs are our priority.  As part of our continuing mission to provide you with exceptional heart care, we have created designated Provider Care Teams.  These Care Teams include your primary Cardiologist (physician) and Advanced Practice Providers (APPs -  Physician Assistants and Nurse Practitioners) who all work  together to provide you with the care you need, when you need it.  We recommend signing up for the patient portal called "MyChart".  Sign up information is provided on this After Visit Summary.  MyChart is used to connect with patients for Virtual Visits (Telemedicine).  Patients are able to view lab/test results, encounter notes, upcoming appointments, etc.  Non-urgent messages can be sent to your provider as well.   To learn more about what you can do with MyChart, go to [email or web address].    Your next appointment:   As needed  The format for your next appointment:   In Person  Provider:   Lennie Odor, MD        Time Spent with Patient: I have spent a total of 25 minutes with patient reviewing hospital notes, telemetry, EKGs, labs and examining the patient as well as establishing an assessment and plan that was discussed with the patient.  > 50% of time was spent in direct patient care.  Signed, Lenna Gilford. Flora Lipps, MD [facility]  [address] [place], [place] [address] [phone]  08/05/2020 12:15 PM

## 2020-08-05 ENCOUNTER — Other Ambulatory Visit: Payer: Self-pay

## 2020-08-05 ENCOUNTER — Ambulatory Visit: Payer: BC Managed Care – PPO | Admitting: Cardiovascular Disease

## 2020-08-05 ENCOUNTER — Encounter: Payer: Self-pay | Admitting: Cardiovascular Disease

## 2020-08-05 VITALS — BP 102/54 | HR 64 | Ht 67.0 in | Wt 168.0 lb

## 2020-08-05 DIAGNOSIS — M940 Chondrocostal junction syndrome [Tietze]: Secondary | ICD-10-CM | POA: Diagnosis not present

## 2020-08-05 DIAGNOSIS — I517 Cardiomegaly: Secondary | ICD-10-CM

## 2020-08-05 DIAGNOSIS — R079 Chest pain, unspecified: Secondary | ICD-10-CM

## 2020-08-05 MED ORDER — IBUPROFEN 800 MG PO TABS: 800.0000 mg | ORAL_TABLET | Freq: Three times a day (TID) | ORAL | 0 refills | Status: AC

## 2020-08-05 NOTE — Patient Instructions (Signed)
Medication Instructions:  Please start ibuprofen 800 mg three times a day for 7 days please drink 80-100 oz of water a day with this *If you need a refill on your cardiac medications before your next appointment, please call your pharmacy*   Follow-Up: At [facility], you and your health needs are our priority.  As part of our continuing mission to provide you with exceptional heart care, we have created designated Provider Care Teams.  These Care Teams include your primary Cardiologist (physician) and Advanced Practice Providers (APPs -  Physician Assistants and Nurse Practitioners) who all work together to provide you with the care you need, when you need it.  We recommend signing up for the patient portal called "MyChart".  Sign up information is provided on this After Visit Summary.  MyChart is used to connect with patients for Virtual Visits (Telemedicine).  Patients are able to view lab/test results, encounter notes, upcoming appointments, etc.  Non-urgent messages can be sent to your provider as well.   To learn more about what you can do with MyChart, go to [email or web address].    Your next appointment:   As needed  The format for your next appointment:   In Person  Provider:   Lennie Odor, MD

## 2020-08-10 DIAGNOSIS — F4311 Post-traumatic stress disorder, acute: Secondary | ICD-10-CM | POA: Diagnosis not present

## 2020-08-18 DIAGNOSIS — F4311 Post-traumatic stress disorder, acute: Secondary | ICD-10-CM | POA: Diagnosis not present

## 2020-08-28 IMAGING — MR MR CARD MORPHOLOGY WO/W CM
1 series · 1 of 1 positions shown · IV contrast (gadavist)
Comparison: none

Addendum:
CLINICAL DATA: 18-year-old female with new diagnosis of
cardiomyopathy.

EXAM:
CARDIAC MRI
TECHNIQUE: The patient was scanned on a 1.5 Tesla GE magnet. A dedicated
cardiac coil was used. Functional imaging was done using Fiesta
sequences. [DATE], and 4 chamber views were done to assess for RWMA's.
Modified Lavallee rule using a short axis stack was used to
calculate an ejection fraction on a dedicated work station using
Circle software. The patient received 10 cc of Gadavist. After 10
minutes inversion recovery sequences were used to assess for
infiltration and scar tissue.
CONTRAST:  10 cc  of Gadavist

[Series 1034: results mr view&go · oblique · 8.0mm · 0.50mm/px · 1 of 1 slices shown]
[im 1/1]
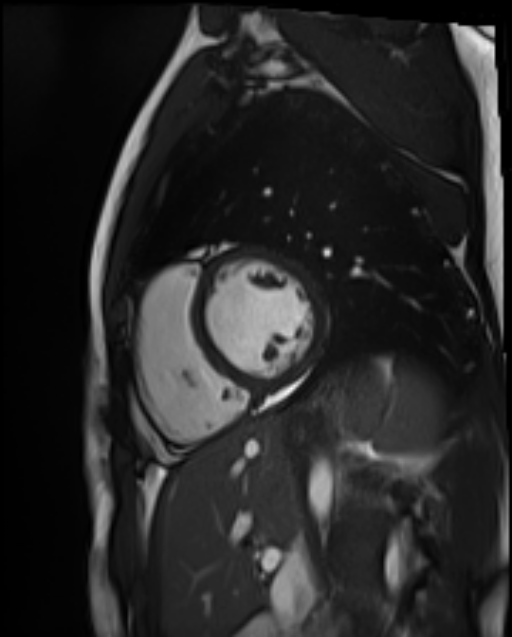

[1 of 1 positions shown; findings below may reference images not displayed]

FINDINGS: 1. Mildly dilated left ventricle with normal wall thickness and
mildly decreased systolic function (LVEF = 50%) and mild diffuse
hypokinesis. There are no regional wall motion abnormalities and
ratio of non-compacted to compacted myocardium [DATE]. There is diffuse
midwall late gadolinium enhancement in the left ventricular
myocardium.

LVEDD: 52 mm

LVESD: 43 mm

LVEDV: 171 ml

LVESV: 85 ml

SV: 86 ml

CO: 4.7 L/min

Myocardial mass: 125 g

2. Mildly dilated right ventricle with normal wall thickness and
mildly diffusely decreased systolic function (LVEF = 40%). There are
no regional wall motion abnormalities.

3.  Normal left and right atrial size.

4. Normal size of the aortic root, ascending aorta and pulmonary
artery.

5.  Trivial mitral and mild tricuspid regurgitation.

6.  Normal pericardium.  No pericardial effusion.
IMPRESSION: 1. Mildly dilated left ventricle with mildly decreased systolic
function (LVEF = 50%) and mild diffuse hypokinesis. There are no
regional wall motion abnormalities and ratio of non-compacted to
compacted myocardium [DATE]. There is diffuse midwall late gadolinium
enhancement in the left ventricular myocardium.

2. Mildly dilated right ventricle with normal thickness and mildly
diffusely decreased systolic function (LVEF = 40%). There are no
regional wall motion abnormalities.

3.  Normal left and right atrial size.

4.  Trivial mitral and mild tricuspid regurgitation.

Collectively, these findings are consistent with non-ischemic
dilated cardiomyopathy with mild biventricular involvement. However,
these findings don't fit the criteria for non-compaction
cardiomyopathy (pronounced non-compacted myocardium with ratio of
non-compacted to compacted myocardium > [DATE]).

ADDENDUM:
After further review of this study, this most probably represents a
case of an athlete's heart. A follow up echocardiogram at rest and
at peak exercise is recommended at 2-3 months.

*** End of Addendum ***
FINDINGS: 1. Mildly dilated left ventricle with normal wall thickness and
mildly decreased systolic function (LVEF = 50%) and mild diffuse
hypokinesis. There are no regional wall motion abnormalities and
ratio of non-compacted to compacted myocardium [DATE]. There is diffuse
midwall late gadolinium enhancement in the left ventricular
myocardium.

LVEDD: 52 mm

LVESD: 43 mm

LVEDV: 171 ml

LVESV: 85 ml

SV: 86 ml

CO: 4.7 L/min

Myocardial mass: 125 g

2. Mildly dilated right ventricle with normal wall thickness and
mildly diffusely decreased systolic function (LVEF = 40%). There are
no regional wall motion abnormalities.

3.  Normal left and right atrial size.

4. Normal size of the aortic root, ascending aorta and pulmonary
artery.

5.  Trivial mitral and mild tricuspid regurgitation.

6.  Normal pericardium.  No pericardial effusion.
IMPRESSION: 1. Mildly dilated left ventricle with mildly decreased systolic
function (LVEF = 50%) and mild diffuse hypokinesis. There are no
regional wall motion abnormalities and ratio of non-compacted to
compacted myocardium [DATE]. There is diffuse midwall late gadolinium
enhancement in the left ventricular myocardium.

2. Mildly dilated right ventricle with normal thickness and mildly
diffusely decreased systolic function (LVEF = 40%). There are no
regional wall motion abnormalities.

3.  Normal left and right atrial size.

4.  Trivial mitral and mild tricuspid regurgitation.

Collectively, these findings are consistent with non-ischemic
dilated cardiomyopathy with mild biventricular involvement. However,
these findings don't fit the criteria for non-compaction
cardiomyopathy (pronounced non-compacted myocardium with ratio of
non-compacted to compacted myocardium > [DATE]).

## 2020-09-24 DIAGNOSIS — F4311 Post-traumatic stress disorder, acute: Secondary | ICD-10-CM | POA: Diagnosis not present

## 2020-09-28 ENCOUNTER — Ambulatory Visit (INDEPENDENT_AMBULATORY_CARE_PROVIDER_SITE_OTHER): Payer: BC Managed Care – PPO | Admitting: Family Medicine

## 2020-09-28 ENCOUNTER — Encounter: Payer: Self-pay | Admitting: Family Medicine

## 2020-09-28 VITALS — BP 121/77 | HR 69 | Ht 67.0 in | Wt 169.0 lb

## 2020-09-28 DIAGNOSIS — R569 Unspecified convulsions: Secondary | ICD-10-CM

## 2020-09-28 DIAGNOSIS — F418 Other specified anxiety disorders: Secondary | ICD-10-CM

## 2020-09-28 MED ORDER — LAMOTRIGINE 100 MG PO TABS: 100.0000 mg | ORAL_TABLET | Freq: Two times a day (BID) | ORAL | 3 refills | Status: DC
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
## 2020-09-28 NOTE — Progress Notes (Signed)
I have read the note, and I agree with the clinical assessment and plan.  Elaf Clauson A. Edan Juday, MD, PhD, FAAN Certified in Neurology, Clinical Neurophysiology, Sleep Medicine, Pain Medicine and Neuroimaging  [facility] [address], [address] [place], [place] [address] [phone]  

## 2020-09-28 NOTE — Patient Instructions (Addendum)
Below is our plan:  We will continue lamotrigine 100mg  twice daily.   Please make sure you are staying well hydrated. I recommend 50-60 ounces daily. Well balanced diet and regular exercise encouraged. Consistent sleep schedule with 6-8 hours recommended.   Please continue follow up with care team as directed.   Follow up via Mychart in 1 year   You may receive a survey regarding today's visit. I encourage you to leave honest feed back as I do use this information to improve patient care. Thank you for seeing me today!      Seizure, Adult A seizure is a sudden burst of abnormal electrical and chemical activity in the brain. Seizures usually last from 30 seconds to 2 minutes.  What are the causes? Common causes of this condition include:  Fever or infection.  Problems that affect the brain. These may include: ? A brain or head injury. ? Bleeding in the brain. ? A brain tumor.  Low levels of blood sugar or salt.  Kidney problems or liver problems.  Conditions that are passed from parent to child (are inherited).  Problems with a substance, such as: ? Having a reaction to a drug or a medicine. ? Stopping the use of a substance all of a sudden (withdrawal).  A stroke.  Disorders that affect how you develop. Sometimes, the cause may not be known.  What increases the risk?  Having someone in your family who has epilepsy. In this condition, seizures happen again and again over time. They have no clear cause.  Having had a tonic-clonic seizure before. This type of seizure causes you to: ? Tighten the muscles of the whole body. ? Lose consciousness.  Having had a head injury or strokes before.  Having had a lack of oxygen at birth. What are the signs or symptoms? There are many types of seizures. The symptoms vary depending on the type of seizure you have. Symptoms during a seizure  Shaking that you cannot control (convulsions) with fast, jerky movements of  muscles.  Stiffness of the body.  Breathing problems.  Feeling mixed up (confused).  Staring or not responding to sound or touch.  Head nodding.  Eyes that blink, flutter, or move fast.  Drooling, grunting, or making clicking sounds with your mouth  Losing control of when you pee or poop. Symptoms before a seizure  Feeling afraid, nervous, or worried.  Feeling like you may vomit.  Feeling like: ? You are moving when you are not. ? Things around you are moving when they are not.  Feeling like you saw or heard something before (dj vu).  Odd tastes or smells.  Changes in how you see. You may see flashing lights or spots. Symptoms after a seizure  Feeling confused.  Feeling sleepy.  Headache.  Sore muscles. How is this treated? If your seizure stops on its own, you will not need treatment. If your seizure lasts longer than 5 minutes, you will normally need treatment. Treatment may include:  Medicines given through an IV tube.  Avoiding things, such as medicines, that are known to cause your seizures.  Medicines to prevent seizures.  A device to prevent or control seizures.  Surgery.  A diet low in carbohydrates and high in fat (ketogenic diet). Follow these instructions at home: Medicines  Take over-the-counter and prescription medicines only as told by your doctor.  Avoid foods or drinks that may keep your medicine from working, such as alcohol. Activity  Follow instructions about driving,  swimming, or doing things that would be dangerous if you had another seizure. Wait until your doctor says it is safe for you to do these things.  If you live in the [place] ask your local department of motor vehicles when you can drive.  Get a lot of rest. Teaching others  Teach friends and family what to do when you have a seizure. They should: ? Help you get down to the ground. ? Protect your head and body. ? Loosen any clothing around your neck. ? Turn you  on your side. ? Know whether or not you need emergency care. ? Stay with you until you are better.  Also, tell them what not to do if you have a seizure. Tell them: ? They should not hold you down. ? They should not put anything in your mouth.   General instructions  Avoid anything that gives you seizures.  Keep a seizure diary. Write down: ? What you remember about each seizure. ? What you think caused each seizure.  Keep all follow-up visits. Contact a doctor if:  You have another seizure or seizures. Call the doctor each time you have a seizure.  The pattern of your seizures changes.  You keep having seizures with treatment.  You have symptoms of being sick or having an infection.  You are not able to take your medicine. Get help right away if:  You have any of these problems: ? A seizure that lasts longer than 5 minutes. ? Many seizures in a row and you do not feel better between seizures. ? A seizure that makes it harder to breathe. ? A seizure and you can no longer speak or use part of your body.  You do not wake up right after a seizure.  You get hurt during a seizure.  You feel confused or have pain right after a seizure. These symptoms may be an emergency. Get help right away. Call your local emergency services (911 in the [place]  Do not wait to see if the symptoms will go away.  Do not drive yourself to the hospital. Summary  A seizure is a sudden burst of abnormal electrical and chemical activity in the brain. Seizures normally last from 30 seconds to 2 minutes.  Causes of seizures include illness, injury to the head, low levels of blood sugar or salt, and certain conditions.  Most seizures will stop on their own in less than 5 minutes. Seizures that last longer than 5 minutes are a medical emergency and need treatment right away.  Many medicines are used to treat seizures. Take over-the-counter and prescription medicines only as told by your  doctor. This information is not intended to replace advice given to you by your health care provider. Make sure you discuss any questions you have with your health care provider. Document Revised: 12/19/2019 Document Reviewed: 12/19/2019 Elsevier Patient Education  [place].

## 2020-10-18 ENCOUNTER — Encounter: Payer: Self-pay | Admitting: Family Medicine

## 2020-10-18 DIAGNOSIS — R569 Unspecified convulsions: Secondary | ICD-10-CM

## 2020-10-25 MED ORDER — LAMOTRIGINE 150 MG PO TABS: 150.0000 mg | ORAL_TABLET | Freq: Two times a day (BID) | ORAL | 5 refills | Status: DC

## 2020-10-29 DIAGNOSIS — F4311 Post-traumatic stress disorder, acute: Secondary | ICD-10-CM | POA: Diagnosis not present

## 2020-11-29 DIAGNOSIS — F4311 Post-traumatic stress disorder, acute: Secondary | ICD-10-CM | POA: Diagnosis not present

## 2021-03-04 ENCOUNTER — Encounter: Payer: Self-pay | Admitting: Family Medicine

## 2021-03-04 ENCOUNTER — Telehealth: Payer: Self-pay | Admitting: Nurse Practitioner

## 2021-03-04 DIAGNOSIS — R569 Unspecified convulsions: Secondary | ICD-10-CM

## 2021-03-04 MED ORDER — LAMOTRIGINE 150 MG PO TABS: 150.0000 mg | ORAL_TABLET | Freq: Two times a day (BID) | ORAL | 5 refills | Status: DC

## 2021-03-04 MED ORDER — FOLIC ACID 5 MG PO CAPS: 1.0000 | ORAL_CAPSULE | Freq: Every day | ORAL | 0 refills | Status: AC

## 2021-03-04 MED ORDER — SERTRALINE HCL 25 MG PO TABS: 25.0000 mg | ORAL_TABLET | Freq: Every day | ORAL | 1 refills | Status: AC

## 2021-03-04 NOTE — Progress Notes (Signed)
Patient ID: Lisa Hancock, female   DOB: 11/15/2000, 20 y.o.   MRN: [id number]     Virtual Visit Consent   Lisa Hancock, you are scheduled for a virtual visit with Lisa Daphine Deutscher, FNP, a [facility] provider, today.     Just as with appointments in the office, your consent must be obtained to participate.  Your consent will be active for this visit and any virtual visit you may have with one of our providers in the next 365 days.     If you have a MyChart account, a copy of this consent can be sent to you electronically.  All virtual visits are billed to your insurance company just like a traditional visit in the office.    As this is a virtual visit, video technology does not allow for your provider to perform a traditional examination.  This may limit your provider's ability to fully assess your condition.  If your provider identifies any concerns that need to be evaluated in person or the need to arrange testing (such as labs, EKG, etc.), we will make arrangements to do so.     Although advances in technology are sophisticated, we cannot ensure that it will always work on either your end or our end.  If the connection with a video visit is poor, the visit may have to be switched to a telephone visit.  With either a video or telephone visit, we are not always able to ensure that we have a secure connection.     I need to obtain your verbal consent now.   Are you willing to proceed with your visit today? YES   Lisa Hancock has provided verbal consent on 03/04/2021 for a virtual visit (video or telephone).   Lisa Daphine Deutscher, FNP   Date: 03/04/2021 1:19 PM   Virtual Visit via Video Note   I, Lisa Hancock, connected with Lisa Hancock ([id number], [birth year]) on 03/04/21 at  1:30 PM EDT by a video-enabled telemedicine application and verified that I am speaking with the correct person using two identifiers.  Location: Patient: Virtual Visit Location Patient: Home Provider:  Virtual Visit Location Provider: Mobile   I discussed the limitations of evaluation and management by telemedicine and the availability of in person appointments. The patient expressed understanding and agreed to proceed.    History of Present Illness: Lisa Hancock is a 20 y.o. who identifies as a female who was assigned female at birth, and is being seen today for Medication refill.  HPI:  Patient does todays video visit for refill medication. She has history of seizures. She is now at [facility] in [place]. She is on lamictal, folic acid and zoloft. She has had several seizures in the last few days. She says she saw her neurologist in April 2022. They know that she is having seizures.    Problems: There are no problems to display for this patient.   Allergies: No Known Allergies Medications:  Current Outpatient Medications:    Folic Acid 5 MG CAPS, Take by mouth. Taking 5 mg by mouth daily, Disp: , Rfl:    ibuprofen (ADVIL) 800 MG tablet, Take 1 tablet (800 mg total) by mouth 3 (three) times daily., Disp: 21 tablet, Rfl: 0   lamoTRIgine (LAMICTAL) 150 MG tablet, Take 1 tablet (150 mg total) by mouth 2 (two) times daily., Disp: 60 tablet, Rfl: 5   sertraline (ZOLOFT) 25 MG tablet, Take 25 mg by mouth daily., Disp: , Rfl:   Observations/Objective: Patient is  well-developed, well-nourished in no acute distress.  Resting comfortably  at home.  Head is normocephalic, traumatic.  No labored breathing.  Speech is clear and coherent with logical content.  Patient is alert and oriented at baseline.  No seizure activity noted duroing visit  Assessment and Plan:  Lisa Hancock in today with chief complaint of Medication Refill   1. Seizures (HCC) Important to keep diary of seizure activity Contact Dr. Nicholas Lose office and let them  now you are still having seizures so medications can be adjusted.  Meds ordered this encounter  Medications   lamoTRIgine (LAMICTAL) 150 MG tablet     Sig: Take 1 tablet (150 mg total) by mouth 2 (two) times daily.    Dispense:  60 tablet    Refill:  5    Dose increase.    Order Specific Question:   Supervising Provider    Answer:   Eber Hong [[id number]]   Folic Acid 5 MG CAPS    Sig: Take 1 capsule (5 mg total) by mouth daily. Taking 5 mg by mouth daily    Dispense:  90 capsule    Refill:  0    Order Specific Question:   Supervising Provider    Answer:   Hyacinth Meeker, BRIAN [[id number]]   sertraline (ZOLOFT) 25 MG tablet    Sig: Take 1 tablet (25 mg total) by mouth daily.    Dispense:  90 tablet    Refill:  1    Order Specific Question:   Supervising Provider    Answer:   Eber Hong [[id number]]      Follow Up Instructions: I discussed the assessment and treatment plan with the patient. The patient was provided an opportunity to ask questions and all were answered. The patient agreed with the plan and demonstrated an understanding of the instructions.  A copy of instructions were sent to the patient via MyChart.  The patient was advised to call back or seek an in-person evaluation if the symptoms worsen or if the condition fails to improve as anticipated.  Time:  I spent 12 minutes with the patient via telehealth technology discussing the above problems/concerns.    Lisa Daphine Deutscher, FNP

## 2021-04-01 ENCOUNTER — Encounter: Payer: Self-pay | Admitting: Family Medicine

## 2021-04-04 NOTE — Telephone Encounter (Signed)
Tried calling pt at [phone] . Did not ring, went to VM and VM not set up to leave message.

## 2021-04-14 ENCOUNTER — Encounter: Payer: Self-pay | Admitting: Neurology

## 2021-04-14 ENCOUNTER — Telehealth (INDEPENDENT_AMBULATORY_CARE_PROVIDER_SITE_OTHER): Payer: Self-pay | Admitting: Neurology

## 2021-04-14 DIAGNOSIS — F418 Other specified anxiety disorders: Secondary | ICD-10-CM

## 2021-04-14 DIAGNOSIS — R569 Unspecified convulsions: Secondary | ICD-10-CM

## 2021-04-14 MED ORDER — LAMOTRIGINE 200 MG PO TABS: 200.0000 mg | ORAL_TABLET | Freq: Two times a day (BID) | ORAL | 11 refills | Status: AC
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
## 2021-09-22 NOTE — Patient Instructions (Incomplete)
Below is our plan: ? ?We will continue lamotrigine 200mg  BID.  ? ?Please make sure you are consistent with timing of seizure medication. I recommend annual visit with primary care provider (PCP) for complete physical and routine blood work. We will monitor vitamin D level. I recommend daily intake of vitamin D (400-800iu) and calcium (800-1000mg ) for bone health. Discuss Dexa screening with PCP.  ? ?According to [place] law, you can not drive unless you are seizure / syncope free for at least 6 months and under physician's care. ? ?Please maintain precautions. Do not participate in activities where a loss of awareness could harm you or someone else. No swimming alone, no tub bathing, no hot tubs, no driving, no operating motorized vehicles (cars, ATVs, motocycles, etc), lawnmowers, power tools or firearms. No standing at heights, such as rooftops, ladders or stairs. Avoid hot objects such as stoves, heaters, open fires. Wear a helmet when riding a bicycle, scooter, skateboard, etc. and avoid areas of traffic. Set your water heater to 120 degrees or less.  ? ? ?Please make sure you are staying well hydrated. I recommend 50-60 ounces daily. Well balanced diet and regular exercise encouraged. Consistent sleep schedule with 6-8 hours recommended.  ? ?Please continue follow up with care team as directed.  ? ?Follow up with me in 1 year  ? ?You may receive a survey regarding today's visit. I encourage you to leave honest feed back as I do use this information to improve patient care. Thank you for seeing me today!  ? ? ?

## 2021-09-22 NOTE — Progress Notes (Deleted)
? ?  PATIENT: Lisa Hancock ?DOB: [birth year] ? ?REASON FOR VISIT: follow up ?HISTORY FROM: patient ? ?Virtual Visit via Telephone Note ? ?I connected with Lisa Hancock on 09/22/21 at  8:00 AM EDT by telephone and verified that I am speaking with the correct person using two identifiers. ?  ?I discussed the limitations, risks, security and privacy concerns of performing an evaluation and management service by telephone and the availability of in person appointments. I also discussed with the patient that there may be a patient responsible charge related to this service. The patient expressed understanding and agreed to proceed. ? ? ?History of Present Illness: ? ?09/22/21 ALL: ?Lisa Hancock is a 21 y.o. female here today for follow up for seizures. She continues lamotrigine 200mg  BID increased at last visit with Dr 03/2021. She had reported no severe spells but had three short (5-10 sec) spells in past 6 months.   ? ? ? ?Observations/Objective: ? ?Generalized: Well developed, in no acute distress  ?Mentation: Alert oriented to time, place, history taking. Follows all commands speech and language fluent ? ? ?Assessment and Plan: ? ?21 y.o. year old female  has a past medical history of Anxiety and Seizure (HCC). here with ? ?No diagnosis found. ? ?No orders of the defined types were placed in this encounter. ? ? ?No orders of the defined types were placed in this encounter. ? ? ? ?Follow Up Instructions: ? ?I discussed the assessment and treatment plan with the patient. The patient was provided an opportunity to ask questions and all were answered. The patient agreed with the plan and demonstrated an understanding of the instructions. ?  ?The patient was advised to call back or seek an in-person evaluation if the symptoms worsen or if the condition fails to improve as anticipated. ? ?I provided *** minutes of non-face-to-face time during this encounter. Patient located at their place of residence during Mychart  visit. Provider is in the office.  ? ? ?21, NP  ?

## 2021-10-04 ENCOUNTER — Telehealth: Payer: Self-pay | Admitting: Family Medicine

## 2021-10-04 DIAGNOSIS — R569 Unspecified convulsions: Secondary | ICD-10-CM
